# Patient Record
Sex: Female | Born: 1990 | Race: Black or African American | Hispanic: No | Smoking: Former smoker
Health system: Southern US, Community
[De-identification: ages and names within clinical notes are randomized; demographics above are authoritative.]

## PROBLEM LIST (undated history)

## (undated) DIAGNOSIS — Z789 Other specified health status: Secondary | ICD-10-CM

## (undated) HISTORY — PX: OTHER SURGICAL HISTORY: SHX169

---

## 2008-10-24 ENCOUNTER — Emergency Department: Payer: Self-pay | Admitting: Emergency Medicine

## 2008-11-13 ENCOUNTER — Emergency Department: Payer: Self-pay | Admitting: Emergency Medicine

## 2009-03-17 ENCOUNTER — Observation Stay: Payer: Self-pay

## 2009-04-28 ENCOUNTER — Inpatient Hospital Stay: Payer: Self-pay | Admitting: Obstetrics & Gynecology

## 2011-10-20 ENCOUNTER — Emergency Department: Payer: Self-pay | Admitting: Emergency Medicine

## 2011-10-20 LAB — PREGNANCY, URINE: Pregnancy Test, Urine: NEGATIVE m[IU]/mL

## 2013-01-23 IMAGING — CR DG KNEE COMPLETE 4+V*R*
1 series · 4 of 4 positions shown · non-contrast
Comparison: none

REASON FOR EXAM: pain
COMMENTS:   May transport without cardiac monitor

PROCEDURE:     DXR - DXR KNEE RT COMP WITH OBLIQUES  - October 20, 2011 [DATE]
RESULT:     Images of the right knee demonstrate no evidence of fracture,
dislocation or radiopaque foreign body.

[Series 1: ap · 0.17mm/px · 4 of 4 slices shown]
[im 1/4]
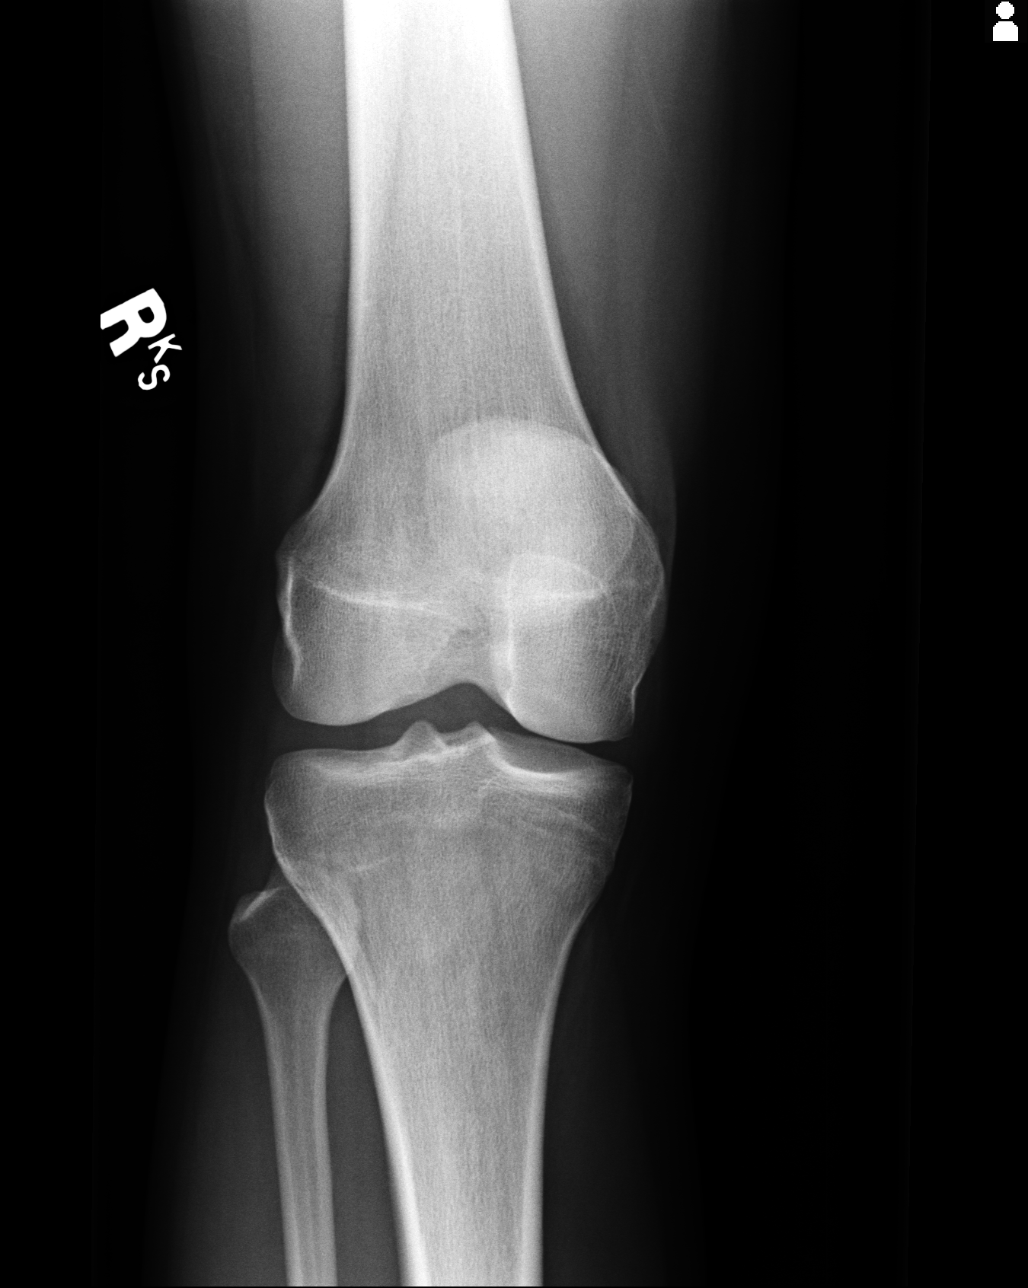
[im 2/4]
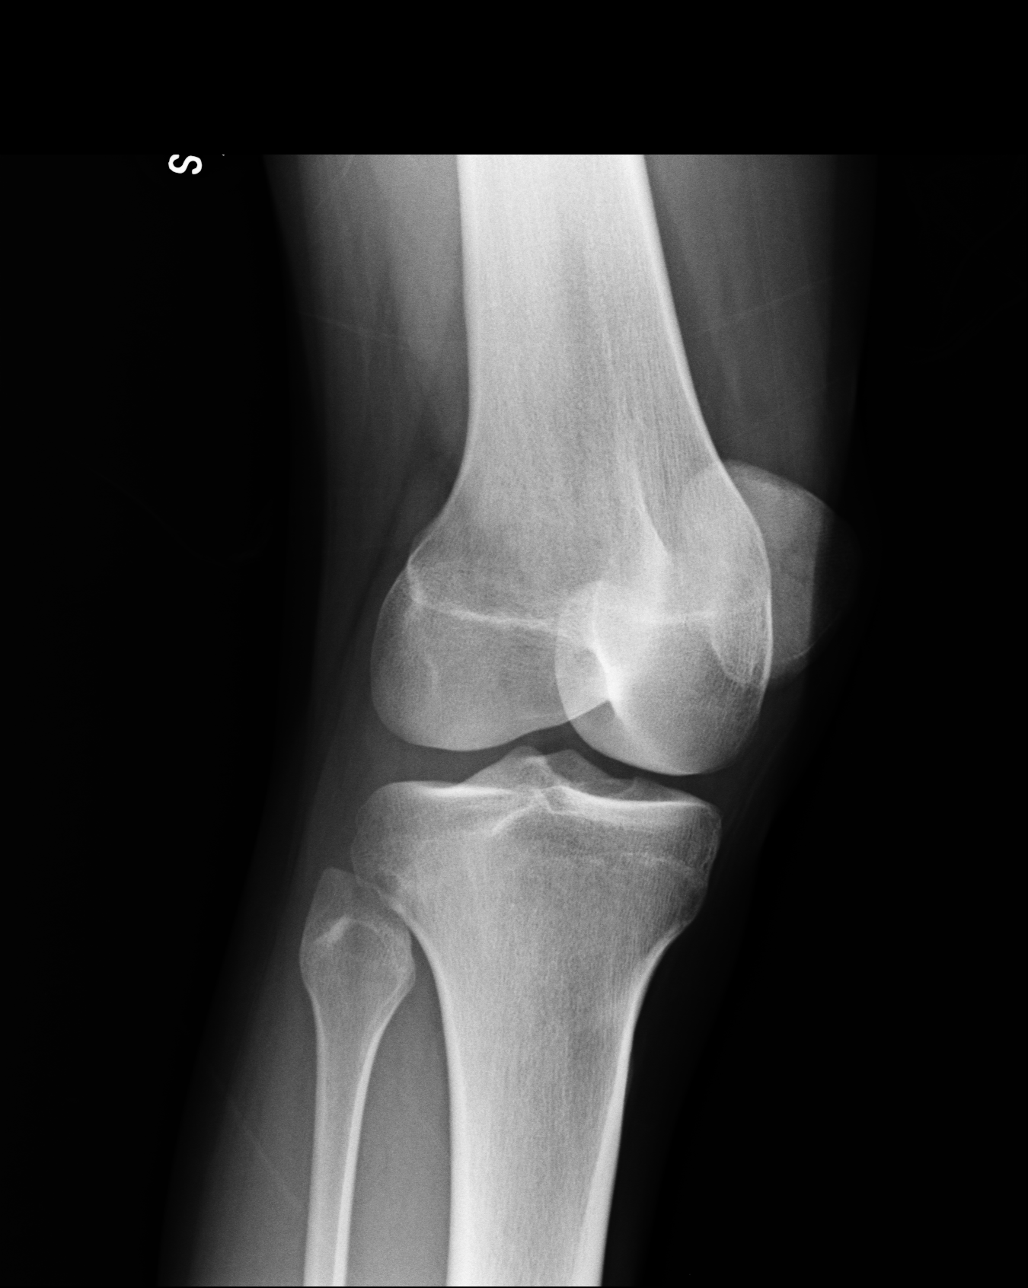
[im 3/4]
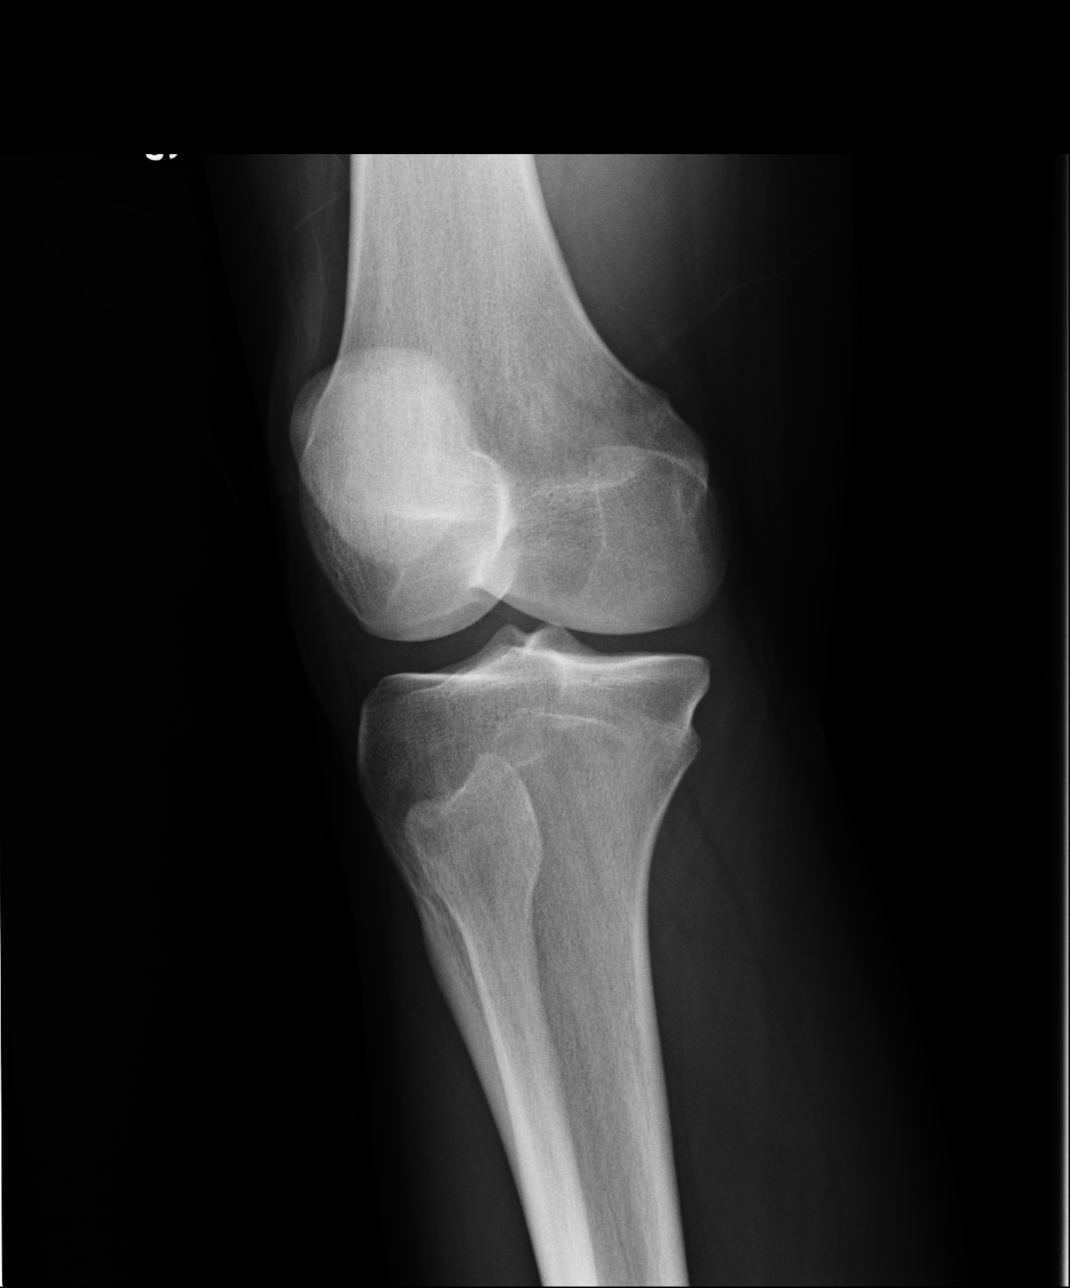
[im 4/4]
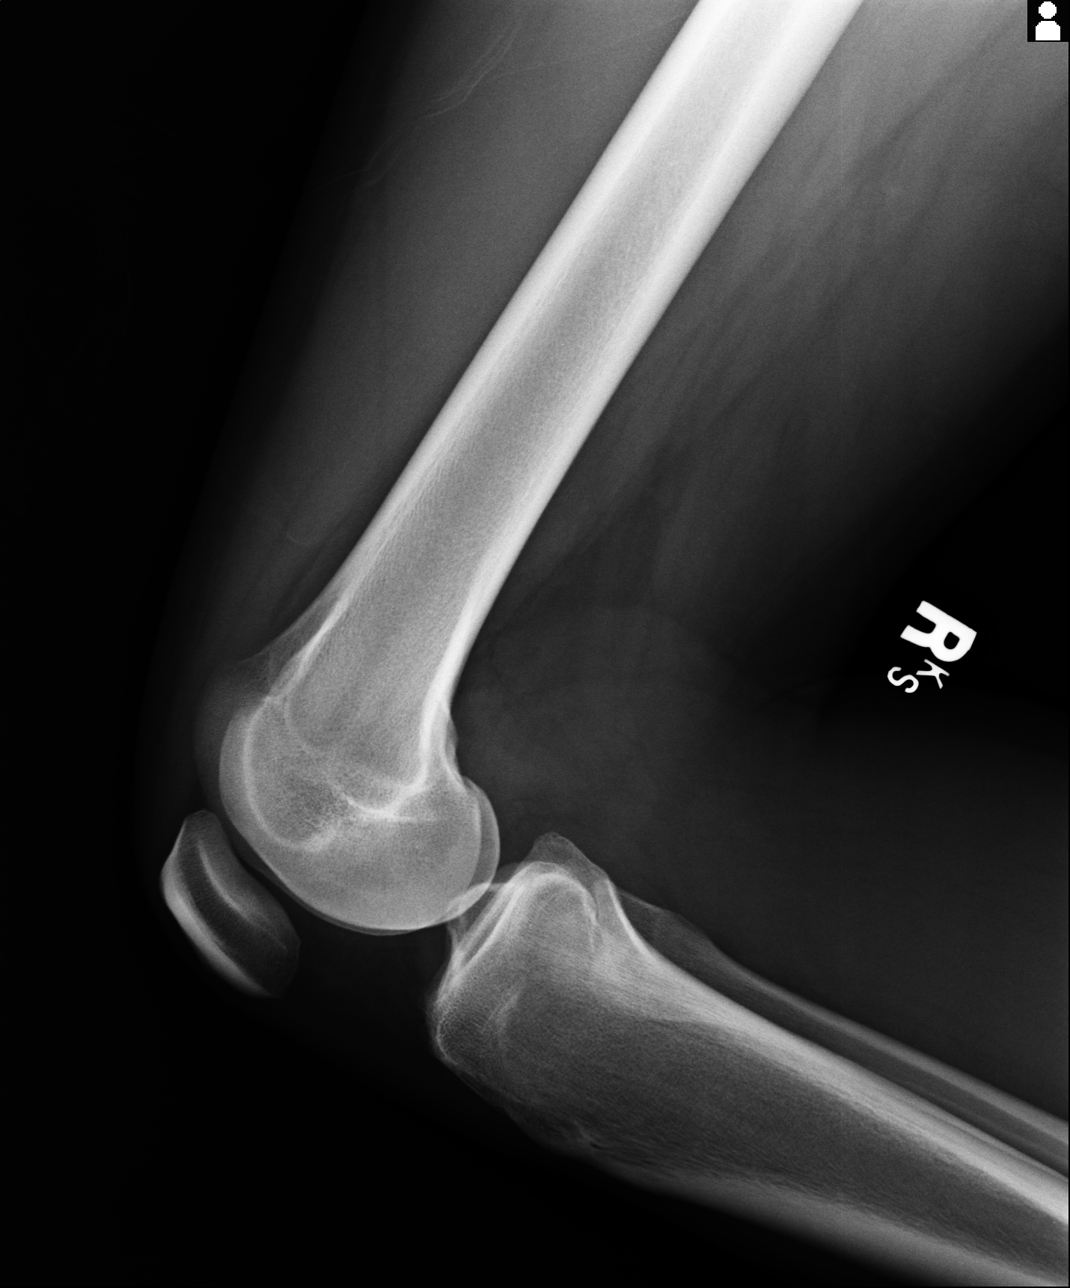

[4 of 4 positions shown; findings below may reference images not displayed]

IMPRESSION: No acute bony abnormality evident.

## 2013-01-23 IMAGING — CR RIGHT HIP - COMPLETE 2+ VIEW
1 series · 2 of 2 positions shown · non-contrast
Comparison: none

REASON FOR EXAM: pain
COMMENTS:   May transport without cardiac monitor

[Series 1: ap · 0.17mm/px · 2 of 2 slices shown]
[im 1/2]
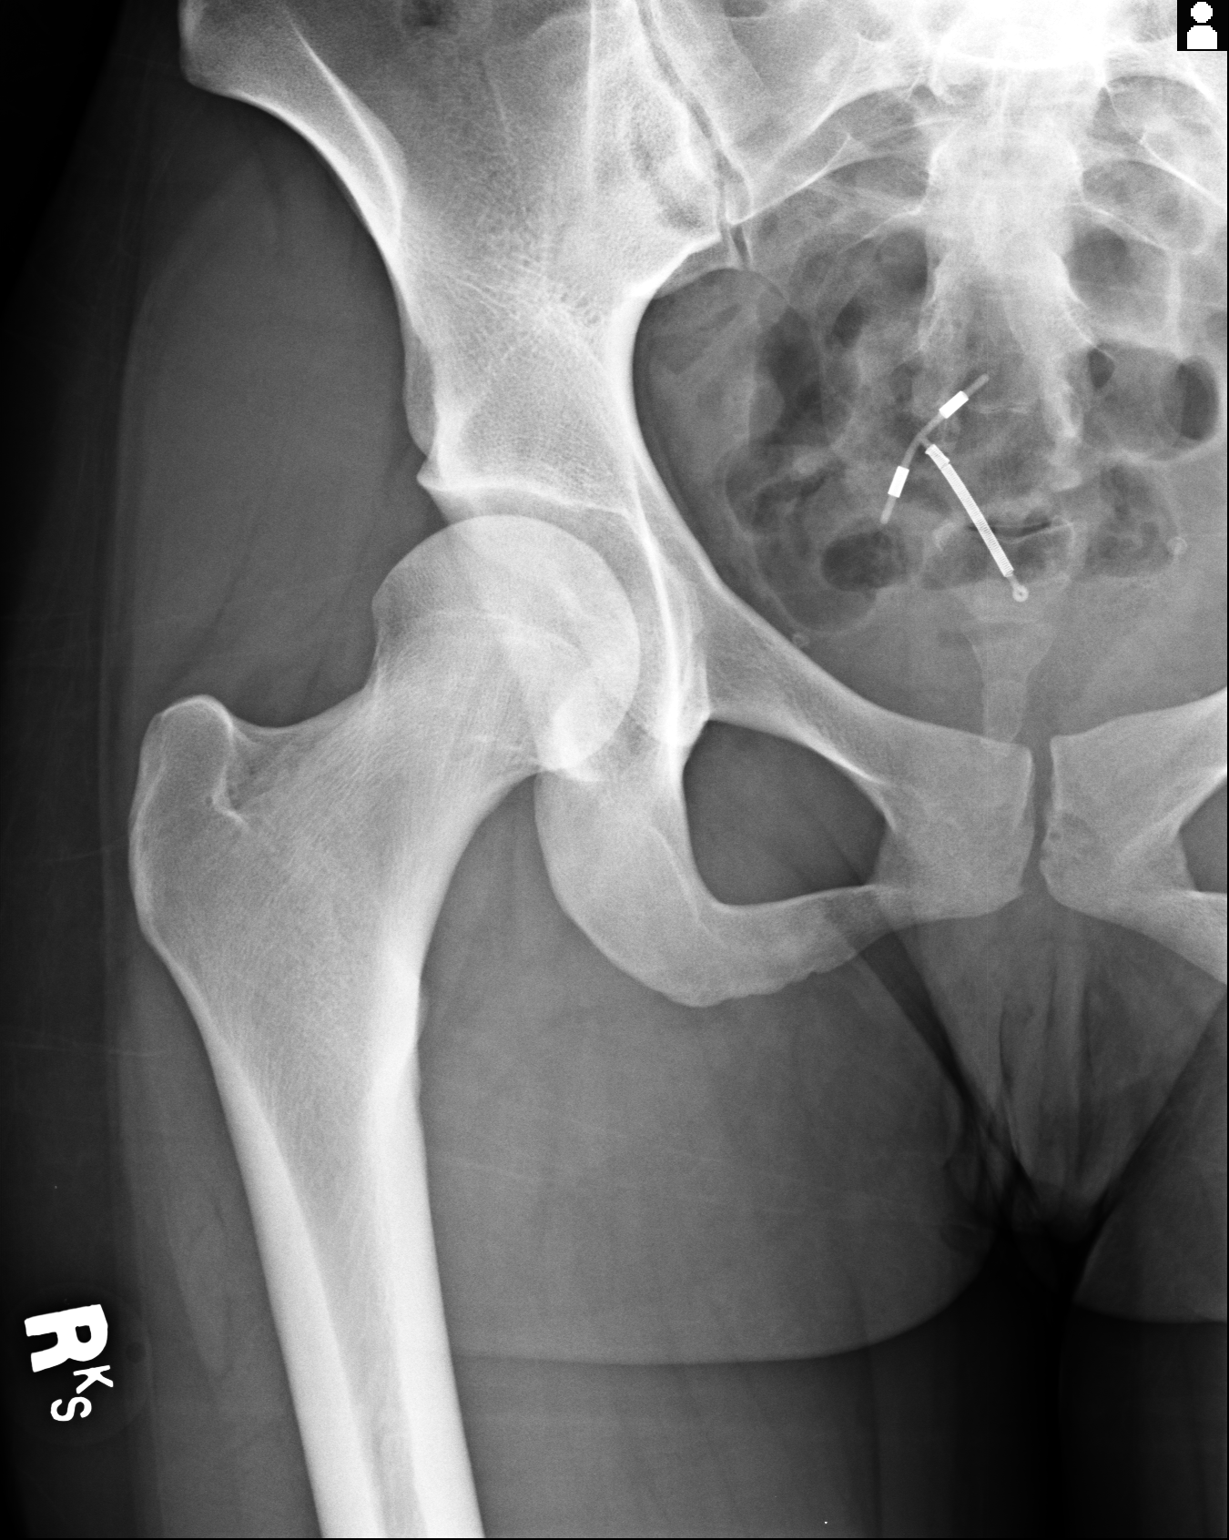
[im 2/2]
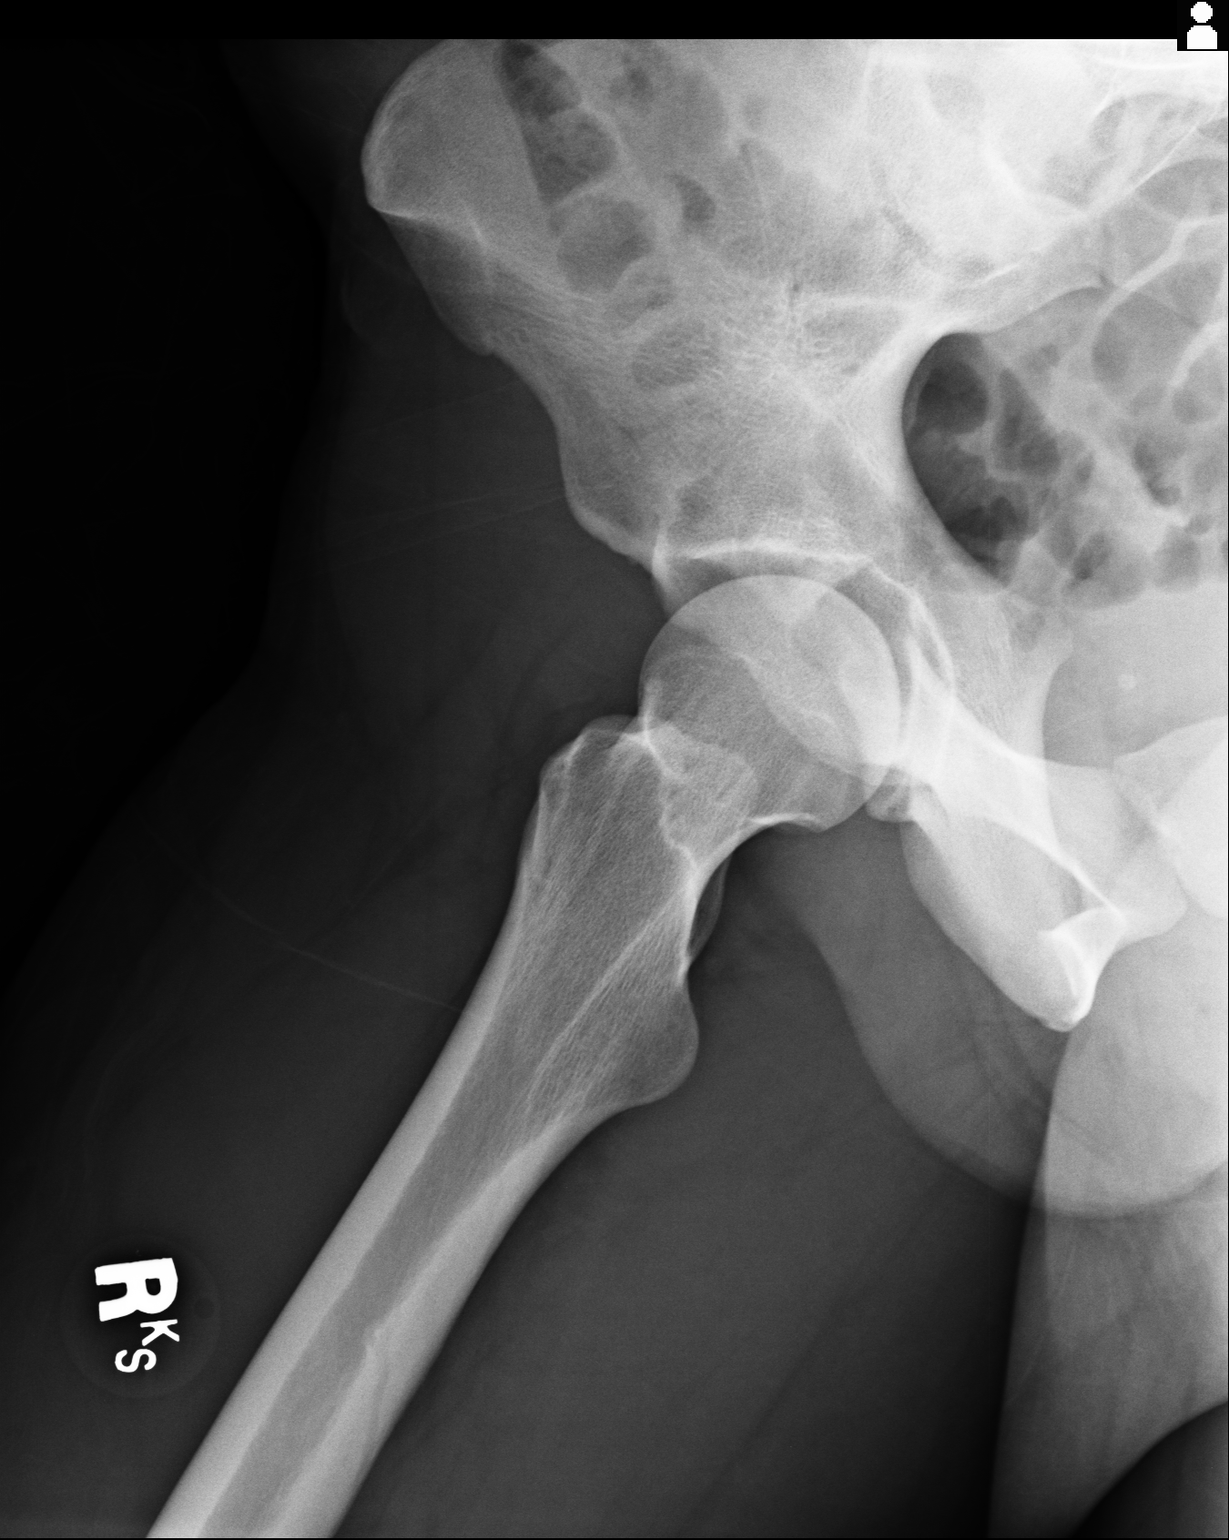

[2 of 2 positions shown; findings below may reference images not displayed]

PROCEDURE:     DXR - DXR HIP RIGHT COMPLETE  - October 20, 2011 [DATE]

RESULT:     IUD is present. Proximal femur and acetabulum appear to be
normal. There is no fracture or dislocation on the AP or frog-leg lateral
images. Prominent vascular channel is seen in the lower margin of the femur
on the frog-leg view.
IMPRESSION: Please see above.

## 2014-12-28 ENCOUNTER — Emergency Department: Payer: Self-pay | Admitting: Emergency Medicine

## 2018-01-23 LAB — HM HIV SCREENING LAB: HM HIV Screening: NEGATIVE

## 2018-01-23 LAB — HM PAP SMEAR

## 2018-03-25 DIAGNOSIS — N879 Dysplasia of cervix uteri, unspecified: Secondary | ICD-10-CM | POA: Insufficient documentation

## 2018-04-11 ENCOUNTER — Emergency Department: Payer: Medicaid Other

## 2018-04-11 ENCOUNTER — Emergency Department
Admission: EM | Admit: 2018-04-11 | Discharge: 2018-04-11 | Disposition: A | Discharge status: Home / Self Care | Payer: Medicaid Other | Attending: Emergency Medicine | Admitting: Emergency Medicine

## 2018-04-11 ENCOUNTER — Encounter: Payer: Self-pay | Admitting: Emergency Medicine

## 2018-04-11 ENCOUNTER — Other Ambulatory Visit: Payer: Self-pay

## 2018-04-11 DIAGNOSIS — J029 Acute pharyngitis, unspecified: Secondary | ICD-10-CM | POA: Diagnosis present

## 2018-04-11 DIAGNOSIS — B9689 Other specified bacterial agents as the cause of diseases classified elsewhere: Secondary | ICD-10-CM | POA: Diagnosis not present

## 2018-04-11 DIAGNOSIS — F17210 Nicotine dependence, cigarettes, uncomplicated: Secondary | ICD-10-CM | POA: Diagnosis not present

## 2018-04-11 DIAGNOSIS — J038 Acute tonsillitis due to other specified organisms: Secondary | ICD-10-CM | POA: Insufficient documentation

## 2018-04-11 LAB — COMPREHENSIVE METABOLIC PANEL
ALT: 10 U/L (ref 0–44)
AST: 16 U/L (ref 15–41)
Albumin: 4.5 g/dL (ref 3.5–5.0)
Alkaline Phosphatase: 65 U/L (ref 38–126)
Anion gap: 9 (ref 5–15)
BUN: 7 mg/dL (ref 6–20)
CHLORIDE: 106 mmol/L (ref 98–111)
CO2: 24 mmol/L (ref 22–32)
CREATININE: 0.67 mg/dL (ref 0.44–1.00)
Calcium: 9.3 mg/dL (ref 8.9–10.3)
Glucose, Bld: 95 mg/dL (ref 70–99)
Potassium: 2.9 mmol/L — ABNORMAL LOW (ref 3.5–5.1)
Sodium: 139 mmol/L (ref 135–145)
Total Bilirubin: 1.2 mg/dL (ref 0.3–1.2)
Total Protein: 8.3 g/dL — ABNORMAL HIGH (ref 6.5–8.1)

## 2018-04-11 LAB — CBC WITH DIFFERENTIAL/PLATELET
BASOS PCT: 0 %
Basophils Absolute: 0 10*3/uL (ref 0–0.1)
EOS PCT: 0 %
Eosinophils Absolute: 0 10*3/uL (ref 0–0.7)
HCT: 38.8 % (ref 35.0–47.0)
HEMOGLOBIN: 13.5 g/dL (ref 12.0–16.0)
LYMPHS ABS: 1.1 10*3/uL (ref 1.0–3.6)
Lymphocytes Relative: 7 %
MCH: 33.3 pg (ref 26.0–34.0)
MCHC: 34.9 g/dL (ref 32.0–36.0)
MCV: 95.7 fL (ref 80.0–100.0)
MONOS PCT: 6 %
Monocytes Absolute: 1 10*3/uL — ABNORMAL HIGH (ref 0.2–0.9)
NEUTROS PCT: 87 %
Neutro Abs: 13.4 10*3/uL — ABNORMAL HIGH (ref 1.4–6.5)
PLATELETS: 214 10*3/uL (ref 150–440)
RBC: 4.05 MIL/uL (ref 3.80–5.20)
RDW: 11.9 % (ref 11.5–14.5)
WBC: 15.5 10*3/uL — AB (ref 3.6–11.0)

## 2018-04-11 LAB — GROUP A STREP BY PCR: Group A Strep by PCR: NOT DETECTED

## 2018-04-11 LAB — LACTIC ACID, PLASMA: LACTIC ACID, VENOUS: 0.8 mmol/L (ref 0.5–1.9)

## 2018-04-11 MED ORDER — MAGIC MOUTHWASH W/LIDOCAINE: 5.0000 mL | Freq: Four times a day (QID) | ORAL | 0 refills | Status: DC

## 2018-04-11 MED ORDER — SODIUM CHLORIDE 0.9 % IV SOLN
3.0000 g | Freq: Once | INTRAVENOUS | Status: AC
  Administered 2018-04-11: 3 g via INTRAVENOUS
  Filled 2018-04-11: qty 3

## 2018-04-11 MED ORDER — DEXAMETHASONE SODIUM PHOSPHATE 10 MG/ML IJ SOLN
10.0000 mg | Freq: Once | INTRAMUSCULAR | Status: AC
  Administered 2018-04-11: 10 mg via INTRAVENOUS
  Filled 2018-04-11: qty 1

## 2018-04-11 MED ORDER — PREDNISONE 10 MG PO TABS: 10.0000 mg | ORAL_TABLET | Freq: Every day | ORAL | 0 refills | Status: DC

## 2018-04-11 MED ORDER — AMOXICILLIN-POT CLAVULANATE 875-125 MG PO TABS: 1.0000 | ORAL_TABLET | Freq: Two times a day (BID) | ORAL | 0 refills | Status: DC

## 2018-04-11 MED ORDER — AMOXICILLIN 875 MG PO TABS: 875.0000 mg | ORAL_TABLET | Freq: Two times a day (BID) | ORAL | 0 refills | Status: DC

## 2018-04-11 MED ORDER — IOHEXOL 350 MG/ML SOLN
60.0000 mL | Freq: Once | INTRAVENOUS | Status: AC | PRN
  Administered 2018-04-11: 75 mL via INTRAVENOUS
  Filled 2018-04-11: qty 60

## 2018-04-11 MED ORDER — DEXAMETHASONE SODIUM PHOSPHATE 10 MG/ML IJ SOLN: 10.0000 mg | Freq: Once | INTRAMUSCULAR | Status: DC

## 2018-04-11 MED ORDER — SODIUM CHLORIDE 0.9 % IV BOLUS
1000.0000 mL | Freq: Once | INTRAVENOUS | Status: AC
  Administered 2018-04-11: 1000 mL via INTRAVENOUS

## 2018-04-11 NOTE — ED Provider Notes (Signed)
[facility] Emergency Department Provider Note  ____________________________________________  Time seen: Approximately 5:09 PM  I have reviewed the triage vital signs and the nursing notes.   HISTORY  Chief Complaint Sore Throat    HPI Sharon Lambert is a 27 y.o. female who presents the emergency department complaining of right-sided sore throat.  Patient has had symptoms x2 days.  Patient denies any headache, visual changes, nasal congestion, ear pain or pressure, coughing, shortness of breath, abdominal pain, nausea or vomiting.  Patient reports sharp sore throat with voice changes.  Patient reports pain with swallowing but no difficulty swallowing or breathing.  Patient has not tried any medication for this complaint.  She denies any other complaints at this time.  No significant medical history.  No recent sick contacts.    History reviewed. No pertinent past medical history.  There are no active problems to display for this patient.   History reviewed. No pertinent surgical history.  Prior to Admission medications   Medication Sig Start Date End Date Taking? Authorizing Provider  amoxicillin (AMOXIL) 875 MG tablet Take 1 tablet (875 mg total) by mouth 2 (two) times daily. 04/11/18   Sherika Kubicki, Delorise Royals, PA-C  amoxicillin-clavulanate (AUGMENTIN) 875-125 MG tablet Take 1 tablet by mouth 2 (two) times daily. 04/11/18   [place] Urieta, Delorise Royals, PA-C  magic mouthwash w/lidocaine SOLN Take 5 mLs by mouth 4 (four) times daily. 04/11/18   Amritpal Shropshire, Delorise Royals, PA-C  predniSONE (DELTASONE) 10 MG tablet Take 1 tablet (10 mg total) by mouth daily. 04/11/18   Anijah Spohr, Delorise Royals, PA-C    Allergies Patient has no known allergies.  No family history on file.  Social History Social History   Tobacco Use  . Smoking status: Current Every Day Smoker    Packs/day: 0.50    Types: Cigarettes  Substance Use Topics  . Alcohol use: Not on file  . Drug use: Not  on file     Review of Systems  Constitutional: No fever/chills Eyes: No visual changes. No discharge ENT: Right-sided sore throat Cardiovascular: no chest pain. Respiratory: no cough. No SOB. Gastrointestinal: No abdominal pain.  No nausea, no vomiting.  No diarrhea.  No constipation. Musculoskeletal: Negative for musculoskeletal pain. Skin: Negative for rash, abrasions, lacerations, ecchymosis. Neurological: Negative for headaches, focal weakness or numbness. 10-point ROS otherwise negative.  ____________________________________________   PHYSICAL EXAM:  VITAL SIGNS: ED Triage Vitals  Enc Vitals Group     BP 04/11/18 1648 (!) 130/93     Pulse Rate 04/11/18 1648 96     Resp 04/11/18 1648 18     Temp 04/11/18 1648 99.8 F (37.7 C)     Temp Source 04/11/18 1648 Oral     SpO2 04/11/18 1648 97 %     Weight 04/11/18 1648 158 lb (71.7 kg)     Height 04/11/18 1648 5\' 8"  (1.727 m)     Head Circumference --      Peak Flow --      Pain Score 04/11/18 1650 9     Pain Loc --      Pain Edu? --      Excl. in GC? --      Constitutional: Alert and oriented. Well appearing and in no acute distress. Eyes: Conjunctivae are normal. PERRL. EOMI. Head: Atraumatic. ENT:      Ears: With cerumen but no impaction.  TMs unremarkable bilaterally.      Nose: No congestion/rhinnorhea.      Mouth/Throat: Mucous membranes  are moist.  Right tonsil is grossly edematous, erythematous, exudates.  Left tonsil is mildly erythematous but not grossly edematous and no exudates.  Uvula is deviated.  Visualization of the oral cavity is otherwise unremarkable. Neck: No stridor.  Neck is supple full range of motion Hematological/Lymphatic/Immunilogical: Diffuse right-sided cervical lymphadenopathy that is tender to palpation.. Cardiovascular: Normal rate, regular rhythm. Normal S1 and S2.  Good peripheral circulation. Respiratory: Normal respiratory effort without tachypnea or retractions. Lungs CTAB. Good  air entry to the bases with no decreased or absent breath sounds. Musculoskeletal: Full range of motion to all extremities. No gross deformities appreciated. Neurologic:  Normal speech and language. No gross focal neurologic deficits are appreciated.  Skin:  Skin is warm, dry and intact. No rash noted. Psychiatric: Mood and affect are normal. Speech and behavior are normal. Patient exhibits appropriate insight and judgement.   ____________________________________________   LABS (all labs ordered are listed, but only abnormal results are displayed)  Labs Reviewed  COMPREHENSIVE METABOLIC PANEL - Abnormal; Notable for the following components:      Result Value   Potassium 2.9 (*)    Total Protein 8.3 (*)    All other components within normal limits  CBC WITH DIFFERENTIAL/PLATELET - Abnormal; Notable for the following components:   WBC 15.5 (*)    Neutro Abs 13.4 (*)    Monocytes Absolute 1.0 (*)    All other components within normal limits  GROUP A STREP BY PCR  CULTURE, BLOOD (ROUTINE X 2)  CULTURE, BLOOD (ROUTINE X 2)  LACTIC ACID, PLASMA   ____________________________________________  EKG   ____________________________________________  RADIOLOGY I personally viewed and evaluated these images as part of my medical decision making, as well as reviewing the written report by the radiologist.  Ct Soft Tissue Neck W Contrast  Result Date: 04/11/2018 CLINICAL DATA:  27 y/o F; sore throat since yesterday. Hard time swallowing. EXAM: CT NECK WITH CONTRAST TECHNIQUE: Multidetector CT imaging of the neck was performed using the standard protocol following the bolus administration of intravenous contrast. CONTRAST:  75mL OMNIPAQUE IOHEXOL 350 MG/ML SOLN COMPARISON:  None. FINDINGS: Pharynx and larynx: Right larger than left palatine tonsil enlargement. No discrete rim enhancing abscess. Mucosal thickening predominantly within the right lateral oropharyngeal and hypopharyngeal wall and  partial effacement of the right aspect of vallecula. Normal sized epiglottis. No laryngeal inflammation. Salivary glands: No inflammation, mass, or stone. Thyroid: Normal. Lymph nodes: None enlarged or abnormal density. Vascular: Negative. Limited intracranial: Negative. Visualized orbits: Negative. Mastoids and visualized paranasal sinuses: Mild left maxillary sinus mucosal thickening. Skeleton: No acute or aggressive process. Upper chest: Negative. Other: None. IMPRESSION: Predominantly right-sided pharyngitis and palatine tonsil inflammation. No discrete rim enhancing collection identified at this time. Electronically Signed   By: Mitzi Hansen M.D.   On: 04/11/2018 19:25    ____________________________________________    PROCEDURES  Procedure(s) performed:    Procedures    Medications  Ampicillin-Sulbactam (UNASYN) 3 g in sodium chloride 0.9 % 100 mL IVPB (0 g Intravenous Stopped 04/11/18 1833)  sodium chloride 0.9 % bolus 1,000 mL (0 mLs Intravenous Stopped 04/11/18 1954)  dexamethasone (DECADRON) injection 10 mg (10 mg Intravenous Given 04/11/18 1758)  iohexol (OMNIPAQUE) 350 MG/ML injection 60 mL (75 mLs Intravenous Contrast Given 04/11/18 1858)     ____________________________________________   INITIAL IMPRESSION / ASSESSMENT AND PLAN / ED COURSE  Pertinent labs & imaging results that were available during my care of the patient were reviewed by me and considered in my  medical decision making (see chart for details).  Review of the [place] CSRS was performed in accordance of the [facility] prior to dispensing any controlled drugs.  Clinical Course as of Apr 11 2005  Sat Apr 11, 2018  1725 She presents emergency department with sore throat, voice changes x2 days.  Patient denies any other URI symptoms.  Exam reveals grossly hypertrophied tonsil with exudates and erythema.  Uvula deviation.  Voice changes that are muffled/reports are appreciated during interview.  Differential at  this time includes peritonsillar abscess, tonsillitis, tonsillar stone, strep, viral pharyngitis.  Given patient's physical exam findings, she will be evaluated with lab work, imaging and I will empirically administer Decadron and Unasyn.   [JC]    Clinical Course User Index [JC] Johndavid Geralds, Delorise RoyalsJonathan D, PA-C     Patient's diagnosis is consistent with right-sided acute bacterial tonsillitis.  Patient presents the emergency department with 2 days of sore throat, voice changes.  On exam, differential is most consistent with peritonsillar abscess a detailed work-up was performed.  Results returned overall with reassuring results with leukocytosis and tonsillitis with no appreciable abscess on CT scan.  Given patient's findings, ENT referral for surgical drainage is not warranted.  Patient was given Unasyn and Decadron in the emergency department.  She will be discharged home with prescription for amoxicillin, Augmentin, Magic mouthwash for symptom relief.  Patient is also placed on 12-day taper of prednisone.  Patient will follow-up with ENT as necessary.  Patient is given ED precautions to return to the ED for any worsening or new symptoms.     ____________________________________________  FINAL CLINICAL IMPRESSION(S) / ED DIAGNOSES  Final diagnoses:  Acute bacterial tonsillitis      NEW MEDICATIONS STARTED DURING THIS VISIT:  ED Discharge Orders        Ordered    amoxicillin-clavulanate (AUGMENTIN) 875-125 MG tablet  2 times daily     04/11/18 1942    amoxicillin (AMOXIL) 875 MG tablet  2 times daily     04/11/18 1942    predniSONE (DELTASONE) 10 MG tablet  Daily    Note to Pharmacy:  Take 6 pills x 2 days, 5 pills x 2 days, 4 pills x 2 days, 3 pills x 2 days, 2 pills x 2 days, and 1 pill x 2 days   04/11/18 1942    magic mouthwash w/lidocaine SOLN  4 times daily    Note to Pharmacy:  Dispense in a 1/1/1 ratio. Use lidocaine, diphenhydramine, prednisolone   04/11/18 1942           This chart was dictated using voice recognition software/Dragon. Despite best efforts to proofread, errors can occur which can change the meaning. Any change was purely unintentional.    Racheal PatchesCuthriell, Mackinzie Vuncannon D, PA-C 04/11/18 2007    Jeanmarie PlantMcShane, James A, MD 04/11/18 2150

## 2018-04-11 NOTE — ED Triage Notes (Signed)
Sore throat since yesterday.

## 2018-04-16 LAB — CULTURE, BLOOD (ROUTINE X 2)
Culture: NO GROWTH
Culture: NO GROWTH

## 2019-10-12 ENCOUNTER — Ambulatory Visit: Payer: HRSA Program | Attending: Internal Medicine

## 2019-10-12 DIAGNOSIS — Z20828 Contact with and (suspected) exposure to other viral communicable diseases: Secondary | ICD-10-CM | POA: Diagnosis present

## 2019-10-12 DIAGNOSIS — Z20822 Contact with and (suspected) exposure to covid-19: Secondary | ICD-10-CM

## 2019-10-13 LAB — NOVEL CORONAVIRUS, NAA: SARS-CoV-2, NAA: NOT DETECTED

## 2019-10-15 NOTE — L&D Delivery Note (Signed)
Delivery Note At 2:30 AM a viable female was delivered via Vaginal, Spontaneous (Presentation: Middle Occiput Anterior).  APGAR: 8, 9; weight 6 lb 15.8 oz (3170 g).   Placenta status: Spontaneous, Intact.  Cord: 3 vessels with the following complications: Short cord and did not cause complications.  Cord pH: n/a  Anesthesia: Epidural Episiotomy: None Lacerations: 2nd degree;Vaginal;Perineal Suture Repair: 3.0 vicryl Est. Blood Loss (mL): 250  Mom to postpartum.  Baby to Couplet care / Skin to Skin.  Called to see patient.  Mom pushed to deliver a viable female infant.  The head followed by shoulders, which delivered without difficulty, and the rest of the body.  No nuchal cord noted.  Baby to mom's chest.  Cord clamped and cut after > 1 min delay.  No cord blood obtained.  Placenta delivered spontaneously, intact, with a 3-vessel cord.  Second degree perineal laceration repaired with 3-0 Vicryl in standard fashion.  All counts correct.  Hemostasis obtained with IV pitocin and fundal massage. EBL 250 mL.  Placenta examined, velamentous cord insertion. Otherwise, appears normal.   Thomasene Mohair, MD 09/07/2020, 2:57 AM

## 2020-01-21 ENCOUNTER — Telehealth: Payer: Self-pay

## 2020-01-21 NOTE — Telephone Encounter (Signed)
Pap letter mailed. Neima Lacross, RN  

## 2020-03-02 ENCOUNTER — Ambulatory Visit (LOCAL_COMMUNITY_HEALTH_CENTER): Payer: Medicaid Other

## 2020-03-02 ENCOUNTER — Other Ambulatory Visit: Payer: Self-pay

## 2020-03-02 ENCOUNTER — Ambulatory Visit: Payer: Medicaid Other | Admitting: Physician Assistant

## 2020-03-02 VITALS — BP 129/80 | Ht 68.0 in | Wt 172.4 lb

## 2020-03-02 DIAGNOSIS — Z3200 Encounter for pregnancy test, result unknown: Secondary | ICD-10-CM

## 2020-03-02 LAB — PREGNANCY, URINE: Preg Test, Ur: POSITIVE — AB

## 2020-03-02 MED ORDER — PRENATAL VITAMINS 28-0.8 MG PO TABS: 1.0000 | ORAL_TABLET | Freq: Every day | ORAL | 0 refills | Status: AC

## 2020-03-02 NOTE — Progress Notes (Signed)
In for PT-plans care at [facility]; has Medicaid Sharlette Dense, RN

## 2020-03-02 NOTE — Addendum Note (Signed)
Addended by: Sharlette Dense on: 03/02/2020 04:16 PM   Modules accepted: Orders

## 2020-03-10 ENCOUNTER — Encounter: Payer: Self-pay | Admitting: Advanced Practice Midwife

## 2020-03-10 ENCOUNTER — Ambulatory Visit (INDEPENDENT_AMBULATORY_CARE_PROVIDER_SITE_OTHER): Payer: Medicaid Other | Admitting: Advanced Practice Midwife

## 2020-03-10 ENCOUNTER — Other Ambulatory Visit: Payer: Self-pay

## 2020-03-10 ENCOUNTER — Other Ambulatory Visit (HOSPITAL_COMMUNITY)
Admission: RE | Admit: 2020-03-10 | Discharge: 2020-03-10 | Disposition: A | Discharge status: Home / Self Care | Payer: Medicaid Other | Source: Ambulatory Visit | Attending: Advanced Practice Midwife | Admitting: Advanced Practice Midwife

## 2020-03-10 VITALS — BP 110/70 | Wt 171.0 lb

## 2020-03-10 DIAGNOSIS — Z3481 Encounter for supervision of other normal pregnancy, first trimester: Secondary | ICD-10-CM | POA: Insufficient documentation

## 2020-03-10 DIAGNOSIS — Z124 Encounter for screening for malignant neoplasm of cervix: Secondary | ICD-10-CM | POA: Diagnosis present

## 2020-03-10 DIAGNOSIS — Z113 Encounter for screening for infections with a predominantly sexual mode of transmission: Secondary | ICD-10-CM | POA: Insufficient documentation

## 2020-03-10 DIAGNOSIS — Z9889 Other specified postprocedural states: Secondary | ICD-10-CM | POA: Insufficient documentation

## 2020-03-10 DIAGNOSIS — O3441 Maternal care for other abnormalities of cervix, first trimester: Secondary | ICD-10-CM

## 2020-03-10 DIAGNOSIS — Z3490 Encounter for supervision of normal pregnancy, unspecified, unspecified trimester: Secondary | ICD-10-CM | POA: Insufficient documentation

## 2020-03-10 NOTE — Progress Notes (Signed)
NOB- constipation

## 2020-03-10 NOTE — Progress Notes (Signed)
New Obstetric Patient H&P    Chief Complaint: "Desires prenatal care"   History of Present Illness: Patient is a 29 y.o. [id number] Not Hispanic or Latino female, presents with amenorrhea and positive home pregnancy test. Patient's last menstrual period was 12/13/2019 (exact date). and based on her  LMP, her EDD is Estimated Date of Delivery: 09/18/20 and her EGA is [redacted]w[redacted]d. Cycles are 5 days, regular, and occur approximately every : 28 days. Her last pap smear was 1 or 2 years ago and was high-grade squamous intraepithelial neoplasia  (HGSIL-encompassing moderate and severe dysplasia). She had a Top-Hat LEEP on 11/19/2018 at [facility].   She had a urine pregnancy test which was positive 2 month(s)  ago. Her last menstrual period was normal and lasted for  4 or 5 day(s). Since her LMP she claims she has experienced breast tenderness, fatigue, nausea, vomiting. She denies vaginal bleeding. Her past medical history is noncontributory. Her prior pregnancies are notable for G1 36w 6#9oz female SVD precipitous labor/delivery, G2 TAB 2019  Since her LMP, she admits to the use of tobacco products  no She claims she has gained   11 pounds since the start of her pregnancy.  There are cats in the home in the home  no  She admits close contact with children on a regular basis  yes  She has had chicken pox in the past no She has had Tuberculosis exposures, symptoms, or previously tested positive for TB   no Current or past history of domestic violence. no  Genetic Screening/Teratology Counseling: (Includes patient, baby's father, or anyone in either family with:)   1. Patient's age >/= 56 [facility]  no 2. Thalassemia ([place], [place], Mediterranean, or Asian background): MCV<80  no 3. Neural tube defect (meningomyelocele, spina bifida, anencephaly)  no 4. Congenital heart defect  no  5. Down syndrome  no 6. Tay-Sachs (Jewish, [place]  no 7. Canavan's Disease  no 8. Sickle cell disease or trait (African)  no    9. Hemophilia or other blood disorders  no  10. Muscular dystrophy  no  11. Cystic fibrosis  no  12. Huntington's Chorea  no  13. Mental retardation/autism  no 14. Other inherited genetic or chromosomal disorder  no 15. Maternal metabolic disorder (DM, PKU, etc)  no 16. Patient or FOB with a child with a birth defect not listed above no  16a. Patient or FOB with a birth defect themselves no 17. Recurrent pregnancy loss, or stillbirth  no  18. Any medications since LMP other than prenatal vitamins (include vitamins, supplements, OTC meds, drugs, alcohol)  no 19. Any other genetic/environmental exposure to discuss  no  Infection History:   1. Lives with someone with TB or TB exposed  no  2. Patient or partner has history of genital herpes  no 3. Rash or viral illness since LMP  no 4. History of STI (GC, CT, HPV, syphilis, HIV)  no 5. History of recent travel :  no  Other pertinent information:  no     Review of Systems:10 point review of systems negative unless otherwise noted in HPI  Past Medical History:  Patient Active Problem List   Diagnosis Date Noted  . Supervision of normal pregnancy 03/10/2020    [facility]inic [facility] Prenatal Labs  Dating  Blood type:     Genetic Screen 1 Screen:    AFP:     Quad:     NIPS: Antibody:   Anatomic [place]  Rubella:  Varicella: @VZVIGG @  GTT Early:               Third trimester:  RPR:     Rhogam  HBsAg:     Vaccines TDAP:                       Flu Shot: HIV:     Baby Food plans formula- reviewed breastfeeding education            GBS:   Contraception  Pap: Hx HGSIL 2019 with LEEP 2020  CBB     CS/VBAC    Support Person Partner [name]        . History of loop electrosurgical excision procedure (LEEP) of cervix affecting pregnancy in first trimester 03/10/2020  . 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 Dysplasia Hx:  1. 02/09/18: ASC-H, trichomonas. Trichomonas treated  2. 03/25/18:  5'o'clock cervical bx CIN2-3; ECC CIN 1 > LEEP (patient lost to follow up and did not present for LEEP) 3. 09/16/18: Pap HSIL ; Cervical bx 11 and 6 o'clock CIN 3; ECC CIN 3 > LEEP  4. 11/18/18: LEEP [ ]      Past Surgical History:  Past Surgical History:  Procedure Laterality Date  . cerivcal Leep     01/2019    Gynecologic History: Patient's last menstrual period was 12/13/2019 (exact date).  Obstetric History: 02/2019  Family History:  Family History  Problem Relation Age of Onset  . Diabetes Father   . Skin cancer Father   . Prostate cancer Paternal Grandfather     Social History:  Social History   Socioeconomic History  . Marital status: Single    Spouse name: Not on file  . Number of children: Not on file  . Years of education: Not on file  . Highest education level: Not on file  Occupational History  . Not on file  Tobacco Use  . Smoking status: Former Smoker    Packs/day: 0.50    Types: Cigarettes  . Smokeless tobacco: Never Used  Substance and Sexual Activity  . Alcohol use: Not Currently  . Drug use: Never  . Sexual activity: Yes    Partners: Male    Birth control/protection: None  Other Topics Concern  . Not on file  Social History Narrative  . Not on file   Social Determinants of Health   Financial Resource Strain:   . Difficulty of Paying Living Expenses:   Food Insecurity:   . Worried About 02/12/2020 in the Last Year:   . [id number] in the Last Year:   Transportation Needs:   . Programme researcher, broadcasting/film/video (Medical):   Barista Lack of Transportation (Non-Medical):   Physical Activity:   . Days of Exercise per Week:   . Minutes of Exercise per Session:   Stress:   . Feeling of Stress :   Social Connections:   . Frequency of Communication with Friends and Family:   . Frequency of Social Gatherings with Friends and Family:   . Attends Religious Services:   . Active Member of Clubs or Organizations:   . Attends Freight forwarder  Meetings:   Marland Kitchen Marital Status:   Intimate Partner Violence: Not At Risk  . Fear of Current or Ex-Partner: No  . Emotionally Abused: No  . Physically Abused: No  . Sexually Abused: No    Allergies:  No Known Allergies  Medications:  Prior to Admission medications   Medication Sig Start Date End Date Taking? Authorizing Provider  magic mouthwash w/lidocaine SOLN Take 5 mLs by mouth 4 (four) times daily. Patient not taking: Reported on 03/02/2020 04/11/18   Cuthriell, Delorise Royals, PA-C  Prenatal Vit-Fe Fumarate-FA (PRENATAL VITAMINS) 28-0.8 MG TABS Take 1 tablet by mouth daily. 03/02/20 06/10/20  Landry Dyke, PA-C    Physical Exam Vitals: Blood pressure 110/70, weight 171 lb (77.6 kg), last menstrual period 12/13/2019.  General: NAD HEENT: normocephalic, anicteric Thyroid: no enlargement, no palpable nodules Pulmonary: No increased work of breathing, CTAB Cardiovascular: RRR, distal pulses 2+ Abdomen: NABS, soft, non-tender, non-distended.  Umbilicus without lesions.  No hepatomegaly, splenomegaly or masses palpable. No evidence of hernia, FHTs 150  Genitourinary:  External: Normal external female genitalia.  Normal urethral meatus, normal  Bartholin's and Skene's glands.    Vagina: Normal vaginal mucosa, no evidence of prolapse.    Cervix: Grossly normal in appearance, friable with PAP, no CMT  Uterus:  Non-enlarged, mobile, normal contour.    Adnexa: ovaries non-enlarged, no adnexal masses  Rectal: deferred Extremities: no edema, erythema, or tenderness Neurologic: Grossly intact Psychiatric: mood appropriate, affect full  The following were addressed during this visit:  Breastfeeding Education - Early initiation of breastfeeding    Comments: Keeps milk supply adequate, helps contract uterus and slow bleeding, and early milk is the perfect first food and is easy to digest.   - The importance of exclusive breastfeeding    Comments: Provides antibodies, Lower risk of  breast and ovarian cancers, and type-2 diabetes,Helps your body recover, Reduced chance of SIDS.   - Risks of giving your baby anything other than breast milk if you are breastfeeding    Comments: Make the baby less content with breastfeeds, may make my baby more susceptible to illness, and may reduce my milk supply.   - The importance of early skin-to-skin contact    Comments: Keeps baby warm and secure, helps keep baby's blood sugar up and breathing steady, easier to bond and breastfeed, and helps calm baby.  - Rooming-in on a 24-hour basis    Comments: Easier to learn baby's feeding cues, easier to bond and get to know each other, and encourages milk production.   - Feeding on demand or baby-led feeding    Comments: Helps prevent breastfeeding complications, helps bring in good milk supply, prevents under or overfeeding, and helps baby feel content and satisfied   - Frequent feeding to help assure optimal milk production    Comments: Making a full supply of milk requires frequent removal of milk from breasts, infant will eat 8-12 times in 24 hours, if separated from infant use breast massage, hand expression and/ or pumping to remove milk from breasts.   - Effective positioning and attachment    Comments: Helps my baby to get enough breast milk, helps to produce an adequate milk supply, and helps prevent nipple pain and damage   - Exclusive breastfeeding for the first 6 months    Comments: Builds a healthy milk supply and keeps it up, protects baby from sickness and disease, and breastmilk has everything your baby needs for the first 6 months.  - Individualized Education    Comments: Contraindications to breastfeeding and other special medical conditions Patient tried to breastfeed first baby without success and thinks she wants to formula feed. She did accept breastfeeding education and encouragement.    Assessment: 29 y.o. [id number] at [redacted]w[redacted]d by LMP presenting to initiate  prenatal  care  Plan: 1) Avoid alcoholic beverages. 2) Patient encouraged not to smoke.  3) Discontinue the use of all non-medicinal drugs and chemicals.  4) Take prenatal vitamins daily.  5) Nutrition, food safety (fish, cheese advisories, and high nitrite foods) and exercise discussed. 6) Hospital and practice style discussed with cross coverage system.  7) Genetic Screening, such as with 1st Trimester Screening, cell free fetal DNA, AFP testing, and Ultrasound, as well as with amniocentesis and CVS as appropriate, is discussed with patient. At the conclusion of today's visit patient requested cell free DNA and MSAFP genetic testing 8) Patient is asked about travel to areas at risk for the Zika virus, and counseled to avoid travel and exposure to mosquitoes or sexual partners who may have themselves been exposed to the virus. Testing is discussed, and will be ordered as appropriate.  9) PAPtima, urine culture, NOB panel, sickle cell screen, MaterniT 21 today 10) Return in 1 week for dating scan and rob   Rod Can, [facility] 03/10/2020, 4:02 PM

## 2020-03-10 NOTE — Patient Instructions (Signed)
Exercise During Pregnancy Exercise is an important part of being healthy for people of all ages. Exercise improves the function of your heart and lungs and helps you maintain strength, flexibility, and a healthy body weight. Exercise also boosts energy levels and elevates mood. Most women should exercise regularly during pregnancy. In rare cases, women with certain medical conditions or complications may be asked to limit or avoid exercise during pregnancy. How does this affect me? Along with maintaining general strength and flexibility, exercising during pregnancy can help:  Keep strength in muscles that are used during labor and childbirth.  Decrease low back pain.  Reduce symptoms of depression.  Control weight gain during pregnancy.  Reduce the risk of needing insulin if you develop diabetes during pregnancy.  Decrease the risk of cesarean delivery.  Speed up your recovery after giving birth. How does this affect my baby? Exercise can help you have a healthy pregnancy. Exercise does not cause premature birth. It will not cause your baby to weigh less at birth. What exercises can I do? Many exercises are safe for you to do during pregnancy. Do a variety of exercises that safely increase your heart and breathing rates and help you build and maintain muscle strength. Do exercises exactly as told by your health care provider. You may do these exercises:  Walking or hiking.  Swimming.  Water aerobics.  Riding a stationary bike.  Strength training.  Modified yoga or Pilates. Tell your instructor that you are pregnant. Avoid overstretching, and avoid lying on your back for long periods of time.  Running or jogging. Only choose this type of exercise if you: ? Ran or jogged regularly before your pregnancy. ? Can run or jog and still talk in complete sentences. What exercises should I avoid? Depending on your level of fitness and whether you exercised regularly before your  pregnancy, you may be told to limit high-intensity exercise. You can tell that you are exercising at a high intensity if you are breathing much harder and faster and cannot hold a conversation while exercising. You must avoid:  Contact sports.  Activities that put you at risk for falling on or being hit in the belly, such as downhill skiing, water skiing, surfing, rock climbing, cycling, gymnastics, and horseback riding.  Scuba diving.  Skydiving.  Yoga or Pilates in a room that is heated to high temperatures.  Jogging or running, unless you ran or jogged regularly before your pregnancy. While jogging or running, you should always be able to talk in full sentences. Do not run or jog so fast that you are unable to have a conversation.  Do not exercise at more than 6,000 feet above sea level (high elevation) if you are not used to exercising at high elevation. How do I exercise in a safe way?   Avoid overheating. Do not exercise in very high temperatures.  Wear loose-fitting, breathable clothes.  Avoid dehydration. Drink enough water before, during, and after exercise to keep your urine pale yellow.  Avoid overstretching. Because of hormone changes during pregnancy, it is easy to overstretch muscles, tendons, and ligaments during pregnancy.  Start slowly and ask your health care provider to recommend the types of exercise that are safe for you.  Do not exercise to lose weight. Follow these instructions at home:  Exercise on most days or all days of the week. Try to exercise for 30 minutes a day, 5 days a week, unless your health care provider tells you not to.  If   you actively exercised before your pregnancy and you are healthy, your health care provider may tell you to continue to do moderate to high-intensity exercise.  If you are just starting to exercise or did not exercise much before your pregnancy, your health care provider may tell you to do low to moderate-intensity  exercise. Questions to ask your health care provider  Is exercise safe for me?  What are signs that I should stop exercising?  Does my health condition mean that I should not exercise during pregnancy?  When should I avoid exercising during pregnancy? Stop exercising and contact a health care provider if: You have any unusual symptoms, such as:  Mild contractions of the uterus or cramps in the abdomen.  Dizziness that does not go away when you rest. Stop exercising and get help right away if: You have any unusual symptoms, such as:  Sudden, severe pain in your low back or your belly.  Mild contractions of the uterus or cramps in the abdomen that do not improve with rest and drinking fluids.  Chest pain.  Bleeding or fluid leaking from your vagina.  Shortness of breath. These symptoms may represent a serious problem that is an emergency. Do not wait to see if the symptoms will go away. Get medical help right away. Call your local emergency services (911 in the [place] Do not drive yourself to the hospital. Summary  Most women should exercise regularly throughout pregnancy. In rare cases, women with certain medical conditions or complications may be asked to limit or avoid exercise during pregnancy.  Do not exercise to lose weight during pregnancy.  Your health care provider will tell you what level of physical activity is right for you.  Stop exercising and contact a health care provider if you have mild contractions of the uterus or cramps in the abdomen. Get help right away if these contractions or cramps do not improve with rest and drinking fluids.  Stop exercising and get help right away if you have sudden, severe pain in your low back or belly, chest pain, shortness of breath, or bleeding or leaking of fluid from your vagina. This information is not intended to replace advice given to you by your health care provider. Make sure you discuss any questions you have with your  health care provider. Document Revised: 01/21/2019 Document Reviewed: 11/04/2018 Elsevier Patient Education  2020 [employer] Eating Plan for Pregnant Women While you are pregnant, your body requires additional nutrition to help support your growing baby. You also have a higher need for some vitamins and minerals, such as folic acid, calcium, iron, and vitamin D. Eating a healthy, well-balanced diet is very important for your health and your baby's health. Your need for extra calories varies for the three 3-month segments of your pregnancy (trimesters). For most women, it is recommended to consume:  150 extra calories a day during the first trimester.  300 extra calories a day during the second trimester.  300 extra calories a day during the third trimester. What are tips for following this plan?   Do not try to lose weight or go on a diet during pregnancy.  Limit your overall intake of foods that have "empty calories." These are foods that have little nutritional value, such as sweets, desserts, candies, and sugar-sweetened beverages.  Eat a variety of foods (especially fruits and vegetables) to get a full range of vitamins and minerals.  Take a prenatal vitamin to help meet your additional vitamin and mineral needs   during pregnancy, specifically for folic acid, iron, calcium, and vitamin D.  Remember to stay active. Ask your health care provider what types of exercise and activities are safe for you.  Practice good food safety and cleanliness. Wash your hands before you eat and after you prepare raw meat. Wash all fruits and vegetables well before peeling or eating. Taking these actions can help to prevent food-borne illnesses that can be very dangerous to your baby, such as listeriosis. Ask your health care provider for more information about listeriosis. What does 150 extra calories look like? Healthy options that provide 150 extra calories each day could be any of the  following:  6-8 oz (170-230 g) of plain low-fat yogurt with  cup of berries.  1 apple with 2 teaspoons (11 g) of peanut butter.  Cut-up vegetables with  cup (60 g) of hummus.  8 oz (230 mL) or 1 cup of low-fat chocolate milk.  1 stick of string cheese with 1 medium orange.  1 peanut butter and jelly sandwich that is made with one slice of whole-wheat bread and 1 tsp (5 g) of peanut butter. For 300 extra calories, you could eat two of those healthy options each day. What is a healthy amount of weight to gain? The right amount of weight gain for you is based on your BMI before you became pregnant. If your BMI:  Was less than 18 (underweight), you should gain 28-40 lb (13-18 kg).  Was 18-24.9 (normal), you should gain 25-35 lb (11-16 kg).  Was 25-29.9 (overweight), you should gain 15-25 lb (7-11 kg).  Was 30 or greater (obese), you should gain 11-20 lb (5-9 kg). What if I am having twins or multiples? Generally, if you are carrying twins or multiples:  You may need to eat 300-600 extra calories a day.  The recommended range for total weight gain is 25-54 lb (11-25 kg), depending on your BMI before pregnancy.  Talk with your health care provider to find out about nutritional needs, weight gain, and exercise that is right for you. What foods can I eat?  Fruits All fruits. Eat a variety of colors and types of fruit. Remember to wash your fruits well before peeling or eating. Vegetables All vegetables. Eat a variety of colors and types of vegetables. Remember to wash your vegetables well before peeling or eating. Grains All grains. Choose whole grains, such as whole-wheat bread, oatmeal, or brown rice. Meats and other protein foods Lean meats, including chicken, turkey, fish, and lean cuts of beef, veal, or pork. If you eat fish or seafood, choose options that are higher in omega-3 fatty acids and lower in mercury, such as salmon, herring, mussels, trout, sardines, pollock,  shrimp, crab, and lobster. Tofu. Tempeh. Beans. Eggs. Peanut butter and other nut butters. Make sure that all meats, poultry, and eggs are cooked to food-safe temperatures or "well-done." Two or more servings of fish are recommended each week in order to get the most benefits from omega-3 fatty acids that are found in seafood. Choose fish that are lower in mercury. You can find more information online:  www.fda.gov Dairy Pasteurized milk and milk alternatives (such as almond milk). Pasteurized yogurt and pasteurized cheese. Cottage cheese. Sour cream. Beverages Water. Juices that contain 100% fruit juice or vegetable juice. Caffeine-free teas and decaffeinated coffee. Drinks that contain caffeine are okay to drink, but it is better to avoid caffeine. Keep your total caffeine intake to less than 200 mg each day (which is 12 oz   or 355 mL of coffee, tea, or soda) or the limit as told by your health care provider. Fats and oils Fats and oils are okay to include in moderation. Sweets and desserts Sweets and desserts are okay to include in moderation. Seasoning and other foods All pasteurized condiments. The items listed above may not be a complete list of foods and beverages you can eat. Contact a dietitian for more information. What foods are not recommended? Fruits Unpasteurized fruit juices. Vegetables Raw (unpasteurized) vegetable juices. Meats and other protein foods Lunch meats, bologna, hot dogs, or other deli meats. (If you must eat those meats, reheat them until they are steaming hot.) Refrigerated pat, meat spreads from a meat counter, smoked seafood that is found in the refrigerated section of a store. Raw or undercooked meats, poultry, and eggs. Raw fish, such as sushi or sashimi. Fish that have high mercury content, such as tilefish, shark, swordfish, and king mackerel. To learn more about mercury in fish, talk with your health care provider or look for online resources, such  as:  www.fda.gov Dairy Raw (unpasteurized) milk and any foods that have raw milk in them. Soft cheeses, such as feta, queso blanco, queso fresco, Brie, Camembert cheeses, blue-veined cheeses, and Panela cheese (unless it is made with pasteurized milk, which must be stated on the label). Beverages Alcohol. Sugar-sweetened beverages, such as sodas, teas, or energy drinks. Seasoning and other foods Homemade fermented foods and drinks, such as pickles, sauerkraut, or kombucha drinks. (Store-bought pasteurized versions of these are okay.) Salads that are made in a store or deli, such as ham salad, chicken salad, egg salad, tuna salad, and seafood salad. The items listed above may not be a complete list of foods and beverages you should avoid. Contact a dietitian for more information. Where to find more information To calculate the number of calories you need based on your height, weight, and activity level, you can use an online calculator such as:  www.choosemyplate.gov/MyPlatePlan To calculate how much weight you should gain during pregnancy, you can use an online pregnancy weight gain calculator such as:  www.choosemyplate.gov/pregnancy-weight-gain-calculator Summary  While you are pregnant, your body requires additional nutrition to help support your growing baby.  Eat a variety of foods, especially fruits and vegetables to get a full range of vitamins and minerals.  Practice good food safety and cleanliness. Wash your hands before you eat and after you prepare raw meat. Wash all fruits and vegetables well before peeling or eating. Taking these actions can help to prevent food-borne illnesses, such as listeriosis, that can be very dangerous to your baby.  Do not eat raw meat or fish. Do not eat fish that have high mercury content, such as tilefish, shark, swordfish, and king mackerel. Do not eat unpasteurized (raw) dairy.  Take a prenatal vitamin to help meet your additional vitamin and  mineral needs during pregnancy, specifically for folic acid, iron, calcium, and vitamin D. This information is not intended to replace advice given to you by your health care provider. Make sure you discuss any questions you have with your health care provider. Document Revised: 02/18/2019 Document Reviewed: 06/27/2017 Elsevier Patient Education  2020 [employer] Prenatal Care Prenatal care is health care during pregnancy. It helps you and your unborn baby (fetus) stay as healthy as possible. Prenatal care may be provided by a midwife, a family practice health care provider, or a childbirth and pregnancy specialist (obstetrician). How does this affect me? During pregnancy, you will be closely monitored   for any new conditions that might develop. To lower your risk of pregnancy complications, you and your health care provider will talk about any underlying conditions you have. How does this affect my baby? Early and consistent prenatal care increases the chance that your baby will be healthy during pregnancy. Prenatal care lowers the risk that your baby will be:  Born early (prematurely).  Smaller than expected at birth (small for gestational age). What can I expect at the first prenatal care visit? Your first prenatal care visit will likely be the longest. You should schedule your first prenatal care visit as soon as you know that you are pregnant. Your first visit is a good time to talk about any questions or concerns you have about pregnancy. At your visit, you and your health care provider will talk about:  Your medical history, including: ? Any past pregnancies. ? Your family's medical history. ? The baby's father's medical history. ? Any long-term (chronic) health conditions you have and how you manage them. ? Any surgeries or procedures you have had. ? Any current over-the-counter or prescription medicines, herbs, or supplements you are taking.  Other factors that could pose a risk  to your baby, including:  Your home setting and your stress levels, including: ? Exposure to abuse or violence. ? Household financial strain. ? Mental health conditions you have.  Your daily health habits, including diet and exercise. Your health care provider will also:  Measure your weight, height, and blood pressure.  Do a physical exam, including a pelvic and breast exam.  Perform blood tests and urine tests to check for: ? Urinary tract infection. ? Sexually transmitted infections (STIs). ? Low iron levels in your blood (anemia). ? Blood type and certain proteins on red blood cells (Rh antibodies). ? Infections and immunity to viruses, such as hepatitis B and rubella. ? HIV (human immunodeficiency virus).  Do an ultrasound to confirm your baby's growth and development and to help predict your estimated due date (EDD). This ultrasound is done with a probe that is inserted into the vagina (transvaginal ultrasound).  Discuss your options for genetic screening.  Give you information about how to keep yourself and your baby healthy, including: ? Nutrition and taking vitamins. ? Physical activity. ? How to manage pregnancy symptoms such as nausea and vomiting (morning sickness). ? Infections and substances that may be harmful to your baby and how to avoid them. ? Food safety. ? Dental care. ? Working. ? Travel. ? Warning signs to watch for and when to call your health care provider. How often will I have prenatal care visits? After your first prenatal care visit, you will have regular visits throughout your pregnancy. The visit schedule is often as follows:  Up to week 28 of pregnancy: once every 4 weeks.  28-36 weeks: once every 2 weeks.  After 36 weeks: every week until delivery. Some women may have visits more or less often depending on any underlying health conditions and the health of the baby. Keep all follow-up and prenatal care visits as told by your health care  provider. This is important. What happens during routine prenatal care visits? Your health care provider will:  Measure your weight and blood pressure.  Check for fetal heart sounds.  Measure the height of your uterus in your abdomen (fundal height). This may be measured starting around week 20 of pregnancy.  Check the position of your baby inside your uterus.  Ask questions about your diet, sleeping patterns, and   whether you can feel the baby move.  Review warning signs to watch for and signs of labor.  Ask about any pregnancy symptoms you are having and how you are dealing with them. Symptoms may include: ? Headaches. ? Nausea and vomiting. ? Vaginal discharge. ? Swelling. ? Fatigue. ? Constipation. ? Any discomfort, including back or pelvic pain. Make a list of questions to ask your health care provider at your routine visits. What tests might I have during prenatal care visits? You may have blood, urine, and imaging tests throughout your pregnancy, such as:  Urine tests to check for glucose, protein, or signs of infection.  Glucose tests to check for a form of diabetes that can develop during pregnancy (gestational diabetes mellitus). This is usually done around week 24 of pregnancy.  An ultrasound to check your baby's growth and development and to check for birth defects. This is usually done around week 20 of pregnancy.  A test to check for group B strep (GBS) infection. This is usually done around week 36 of pregnancy.  Genetic testing. This may include blood or imaging tests, such as an ultrasound. Some genetic tests are done during the first trimester and some are done during the second trimester. What else can I expect during prenatal care visits? Your health care provider may recommend getting certain vaccines during pregnancy. These may include:  A yearly flu shot (annual influenza vaccine). This is especially important if you will be pregnant during flu  season.  Tdap (tetanus, diphtheria, pertussis) vaccine. Getting this vaccine during pregnancy can protect your baby from whooping cough (pertussis) after birth. This vaccine may be recommended between weeks 27 and 36 of pregnancy. Later in your pregnancy, your health care provider may give you information about:  Childbirth and breastfeeding classes.  Choosing a health care provider for your baby.  Umbilical cord banking.  Breastfeeding.  Birth control after your baby is born.  The hospital labor and delivery unit and how to tour it.  Registering at the hospital before you go into labor. Where to find more information  Office on Women's Health: womenshealth.gov  American Pregnancy Association: americanpregnancy.org  March of Dimes: marchofdimes.org Summary  Prenatal care helps you and your baby stay as healthy as possible during pregnancy.  Your first prenatal care visit will most likely be the longest.  You will have visits and tests throughout your pregnancy to monitor your health and your baby's health.  Bring a list of questions to your visits to ask your health care provider.  Make sure to keep all follow-up and prenatal care visits with your health care provider. This information is not intended to replace advice given to you by your health care provider. Make sure you discuss any questions you have with your health care provider. Document Revised: 01/20/2019 Document Reviewed: 09/29/2017 Elsevier Patient Education  2020 [employer]  

## 2020-03-12 ENCOUNTER — Encounter: Payer: Self-pay | Admitting: Advanced Practice Midwife

## 2020-03-12 LAB — URINE CULTURE: Organism ID, Bacteria: NO GROWTH

## 2020-03-14 LAB — RPR+RH+ABO+RUB AB+AB SCR+CB...
Antibody Screen: NEGATIVE
HIV Screen 4th Generation wRfx: NONREACTIVE
Hematocrit: 35.3 % (ref 34.0–46.6)
Hemoglobin: 12.3 g/dL (ref 11.1–15.9)
Hepatitis B Surface Ag: NEGATIVE
MCH: 33.8 pg — ABNORMAL HIGH (ref 26.6–33.0)
MCHC: 34.8 g/dL (ref 31.5–35.7)
MCV: 97 fL (ref 79–97)
Platelets: 243 10*3/uL (ref 150–450)
RBC: 3.64 x10E6/uL — ABNORMAL LOW (ref 3.77–5.28)
RDW: 11.2 % — ABNORMAL LOW (ref 11.7–15.4)
RPR Ser Ql: NONREACTIVE
Rh Factor: POSITIVE
Rubella Antibodies, IGG: 3.43 index (ref >0.99)
Varicella zoster IgG: 558 index (ref >165)
WBC: 9.2 10*3/uL (ref 3.4–10.8)

## 2020-03-14 LAB — HGB FRACTIONATION CASCADE
Hgb A2: 2.5 % (ref 1.8–3.2)
Hgb A: 96 % — ABNORMAL LOW (ref 96.4–98.8)
Hgb F: 1.5 % (ref 0.0–2.0)
Hgb S: 0 %

## 2020-03-16 LAB — CYTOLOGY - PAP
Chlamydia: NEGATIVE
Comment: NEGATIVE
Comment: NEGATIVE
Comment: NEGATIVE
Comment: NORMAL
Diagnosis: NEGATIVE
High risk HPV: NEGATIVE
Neisseria Gonorrhea: NEGATIVE
Trichomonas: NEGATIVE

## 2020-03-16 LAB — MATERNIT 21 PLUS CORE, BLOOD
Fetal Fraction: 5
Result (T21): NEGATIVE
Trisomy 13 (Patau syndrome): NEGATIVE
Trisomy 18 (Edwards syndrome): NEGATIVE
Trisomy 21 (Down syndrome): NEGATIVE

## 2020-03-22 ENCOUNTER — Other Ambulatory Visit: Payer: Self-pay

## 2020-03-22 ENCOUNTER — Encounter: Payer: Self-pay | Admitting: Obstetrics and Gynecology

## 2020-03-22 ENCOUNTER — Ambulatory Visit (INDEPENDENT_AMBULATORY_CARE_PROVIDER_SITE_OTHER): Payer: Medicaid Other | Admitting: Obstetrics and Gynecology

## 2020-03-22 ENCOUNTER — Other Ambulatory Visit: Payer: Self-pay | Admitting: Advanced Practice Midwife

## 2020-03-22 ENCOUNTER — Ambulatory Visit (INDEPENDENT_AMBULATORY_CARE_PROVIDER_SITE_OTHER): Payer: Medicaid Other

## 2020-03-22 VITALS — BP 110/70 | Ht 68.0 in | Wt 171.0 lb

## 2020-03-22 DIAGNOSIS — Z3402 Encounter for supervision of normal first pregnancy, second trimester: Secondary | ICD-10-CM

## 2020-03-22 DIAGNOSIS — O3441 Maternal care for other abnormalities of cervix, first trimester: Secondary | ICD-10-CM

## 2020-03-22 DIAGNOSIS — Z3481 Encounter for supervision of other normal pregnancy, first trimester: Secondary | ICD-10-CM | POA: Diagnosis not present

## 2020-03-22 DIAGNOSIS — Z9889 Other specified postprocedural states: Secondary | ICD-10-CM

## 2020-03-22 LAB — POCT URINALYSIS DIPSTICK OB
Glucose, UA: NEGATIVE
POC,PROTEIN,UA: NEGATIVE

## 2020-03-22 NOTE — Patient Instructions (Signed)

## 2020-03-22 NOTE — Progress Notes (Signed)
      Routine Prenatal Care Visit  Subjective  Sharon Lambert is a 29 y.o. [phone] at [redacted]w[redacted]d being seen today for ongoing prenatal care.  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  ----------------------------------------------------------------------------------- Patient reports no complaints.   Contractions: Not present. Vag. Bleeding: None.  Movement: Absent. Denies leaking of fluid.  ----------------------------------------------------------------------------------- The following portions of the patient's history were reviewed and updated as appropriate: allergies, current medications, past family history, past medical history, past social history, past surgical history and problem list. Problem list updated.   Objective  Blood pressure 110/70, height 5\' 8"  (1.727 m), weight 171 lb (77.6 kg), last menstrual period 12/13/2019. Pregravid weight 160 lb (72.6 kg) Total Weight Gain 11 lb (4.99 kg) Urinalysis:      Fetal Status: Fetal Heart Rate (bpm): 138   Movement: Absent     General:  Alert, oriented and cooperative. Patient is in no acute distress.  Skin: Skin is warm and dry. No rash noted.   Cardiovascular: Normal heart rate noted  Respiratory: Normal respiratory effort, no problems with respiration noted  Abdomen: Soft, gravid, appropriate for gestational age. Pain/Pressure: Absent     Pelvic:  Cervical exam deferred        Extremities: Normal range of motion.  Edema: None  Mental Status: Normal mood and affect. Normal behavior. Normal judgment and thought content.     Assessment   28 y.o. 02/12/2020 at [redacted]w[redacted]d by  09/18/2020, by Last Menstrual Period presenting for routine prenatal visit  Plan   Pregnant Problems (from 03/02/20 to present)    Problem Noted Resolved   Supervision of normal pregnancy 03/10/2020 by 03/12/2020, CNM No   Overview Addendum 03/22/2020  3:28 PM by 05/22/2020, MD    [facility]inic [facility]atal [facility]  Dating Lmp=14wk Natale Milch Blood type: B/Positive/-- (05/28 1600)   Genetic Screen  NIPS: normal xx Antibody:Negative (05/28 1600)  Anatomic 11-02-1976  Rubella: 3.43 (05/28 1600)  Varicella: immune  GTT Third trimester:  RPR: Non Reactive (05/28 1600)   Rhogam Not needed HBsAg: Negative (05/28 1600)   Vaccines TDAP:                       Flu Shot: HIV: Non Reactive (05/28 1600)   Baby Food plans formula- reviewed breastfeeding education            GBS:   Contraception  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    Return in about 2 weeks (around 04/05/2020) for ROB in person and TVUS cervical length with MD.  04/07/2020 MD [facility], [facility] 03/22/2020, 3:29 PM

## 2020-03-22 NOTE — Addendum Note (Signed)
Addended by: Clement Husbands A on: 03/22/2020 03:40 PM   Modules accepted: Orders

## 2020-03-31 ENCOUNTER — Other Ambulatory Visit: Payer: Medicaid Other

## 2020-04-03 ENCOUNTER — Other Ambulatory Visit: Payer: Self-pay

## 2020-04-03 ENCOUNTER — Ambulatory Visit (LOCAL_COMMUNITY_HEALTH_CENTER): Payer: Medicaid Other

## 2020-04-03 DIAGNOSIS — Z111 Encounter for screening for respiratory tuberculosis: Secondary | ICD-10-CM

## 2020-04-06 ENCOUNTER — Ambulatory Visit (INDEPENDENT_AMBULATORY_CARE_PROVIDER_SITE_OTHER): Payer: Medicaid Other | Admitting: Obstetrics and Gynecology

## 2020-04-06 ENCOUNTER — Ambulatory Visit (INDEPENDENT_AMBULATORY_CARE_PROVIDER_SITE_OTHER): Payer: Medicaid Other

## 2020-04-06 ENCOUNTER — Other Ambulatory Visit: Payer: Self-pay

## 2020-04-06 ENCOUNTER — Ambulatory Visit (LOCAL_COMMUNITY_HEALTH_CENTER): Payer: Medicaid Other | Admitting: Family Medicine

## 2020-04-06 ENCOUNTER — Encounter: Payer: Self-pay | Admitting: Obstetrics and Gynecology

## 2020-04-06 VITALS — BP 134/72 | Ht 68.0 in | Wt 173.4 lb

## 2020-04-06 DIAGNOSIS — Z3A16 16 weeks gestation of pregnancy: Secondary | ICD-10-CM

## 2020-04-06 DIAGNOSIS — Z3402 Encounter for supervision of normal first pregnancy, second trimester: Secondary | ICD-10-CM

## 2020-04-06 DIAGNOSIS — O3441 Maternal care for other abnormalities of cervix, first trimester: Secondary | ICD-10-CM

## 2020-04-06 DIAGNOSIS — Z111 Encounter for screening for respiratory tuberculosis: Secondary | ICD-10-CM

## 2020-04-06 DIAGNOSIS — Z9889 Other specified postprocedural states: Secondary | ICD-10-CM

## 2020-04-06 LAB — POCT URINALYSIS DIPSTICK OB
Glucose, UA: NEGATIVE
POC,PROTEIN,UA: NEGATIVE

## 2020-04-06 LAB — TB SKIN TEST
Induration: 0 mm
TB Skin Test: NEGATIVE

## 2020-04-06 NOTE — Addendum Note (Signed)
Addended by: Clement Husbands A on: 04/06/2020 04:06 PM   Modules accepted: Orders

## 2020-04-06 NOTE — Progress Notes (Signed)
    Routine Prenatal Care Visit  Subjective  Sharon Lambert is a 29 y.o. [phone] at [redacted]w[redacted]d being seen today for ongoing prenatal care.  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  ----------------------------------------------------------------------------------- Patient reports no complaints.   Contractions: Not present. Vag. Bleeding: None.  Movement: Absent. Denies leaking of fluid.  ----------------------------------------------------------------------------------- The following portions of the patient's history were reviewed and updated as appropriate: allergies, current medications, past family history, past medical history, past social history, past surgical history and problem list. Problem list updated.   Objective  Blood pressure 134/72, height 5\' 8"  (1.727 m), weight 173 lb 6.4 oz (78.7 kg), last menstrual period 12/13/2019. Pregravid weight 160 lb (72.6 kg) Total Weight Gain 13 lb 6.4 oz (6.078 kg) Urinalysis:      Fetal Status: Fetal Heart Rate (bpm): 130   Movement: Absent     General:  Alert, oriented and cooperative. Patient is in no acute distress.  Skin: Skin is warm and dry. No rash noted.   Cardiovascular: Normal heart rate noted  Respiratory: Normal respiratory effort, no problems with respiration noted  Abdomen: Soft, gravid, appropriate for gestational age. Pain/Pressure: Absent     Pelvic:  Cervical exam deferred        Extremities: Normal range of motion.  Edema: None  Mental Status: Normal mood and affect. Normal behavior. Normal judgment and thought content.     Assessment   28 y.o. 02/12/2020 at [redacted]w[redacted]d by  09/18/2020, by Last Menstrual Period presenting for routine prenatal visit  Plan   Pregnant Problems (from 03/02/20 to  present)    Problem Noted Resolved   Supervision of normal pregnancy 03/10/2020 by 03/12/2020, CNM No   Overview Addendum 03/22/2020  3:28 PM by 05/22/2020, MD    [facility]  Dating Lmp=14wk Natale Milch Blood type: B/Positive/-- (05/28 1600)   Genetic Screen  NIPS: normal xx Antibody:Negative (05/28 1600)  Anatomic 11-02-1976  Rubella: 3.43 (05/28 1600)  Varicella: immune  GTT Third trimester:  RPR: Non Reactive (05/28 1600)   Rhogam Not needed HBsAg: Negative (05/28 1600)   Vaccines TDAP:                       Flu Shot: HIV: Non Reactive (05/28 1600)   Baby Food plans formula- reviewed breastfeeding education            GBS:   Contraception  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    Return in about 4 weeks (around 05/04/2020) for ROB in person and anatomy 05/06/2020.  [place] MD [facility], [facility] 04/06/2020, 3:55 PM

## 2020-04-06 NOTE — Patient Instructions (Signed)

## 2020-05-04 ENCOUNTER — Ambulatory Visit (INDEPENDENT_AMBULATORY_CARE_PROVIDER_SITE_OTHER): Payer: Medicaid Other

## 2020-05-04 ENCOUNTER — Ambulatory Visit (INDEPENDENT_AMBULATORY_CARE_PROVIDER_SITE_OTHER): Payer: Medicaid Other | Admitting: Obstetrics

## 2020-05-04 ENCOUNTER — Other Ambulatory Visit: Payer: Self-pay

## 2020-05-04 VITALS — BP 120/70 | Wt 175.0 lb

## 2020-05-04 DIAGNOSIS — O358XX0 Maternal care for other (suspected) fetal abnormality and damage, not applicable or unspecified: Secondary | ICD-10-CM

## 2020-05-04 DIAGNOSIS — Z3480 Encounter for supervision of other normal pregnancy, unspecified trimester: Secondary | ICD-10-CM

## 2020-05-04 DIAGNOSIS — O35EXX0 Maternal care for other (suspected) fetal abnormality and damage, fetal genitourinary anomalies, not applicable or unspecified: Secondary | ICD-10-CM

## 2020-05-04 DIAGNOSIS — Z3A20 20 weeks gestation of pregnancy: Secondary | ICD-10-CM

## 2020-05-04 DIAGNOSIS — Z3402 Encounter for supervision of normal first pregnancy, second trimester: Secondary | ICD-10-CM | POA: Diagnosis not present

## 2020-05-04 NOTE — Progress Notes (Signed)
Routine Prenatal Care Visit  Subjective  Sharon Lambert is a 29 y.o. [phone] at [redacted]w[redacted]d being seen today for ongoing prenatal care.  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  ----------------------------------------------------------------------------------- Patient reports no complaints.  She just completed her anatomy scan and knows that she has a girl.   Contractions: Not present. Vag. Bleeding: None.  Movement: Present. Leaking Fluid denies.  ----------------------------------------------------------------------------------- The following portions of the patient's history were reviewed and updated as appropriate: allergies, current medications, past family history, past medical history, past social history, past surgical history and problem list. Problem list updated.  Objective  Blood pressure 120/70, weight 175 lb (79.4 kg), last menstrual period 12/13/2019. Pregravid weight 160 lb (72.6 kg) Total Weight Gain 15 lb (6.804 kg) Urinalysis: Urine Protein    Urine Glucose    Fetal Status: Fetal Heart Rate (bpm): 144   Movement: Present     General:  Alert, oriented and cooperative. Patient is in no acute distress.  Skin: Skin is warm and dry. No rash noted.   Cardiovascular: Normal heart rate noted  Respiratory: Normal respiratory effort, no problems with respiration noted  Abdomen: Soft, gravid, appropriate for gestational age. Pain/Pressure: Absent     Pelvic:  Cervical exam deferred        Extremities: Normal range of motion.  Edema: None  Mental Status: Normal mood and affect. Normal behavior. Normal judgment and thought content.   See anatomy ultrasound report: left polycystic kidney noted. Assessment   29 y.o. [id number] at [redacted]w[redacted]d by  09/18/2020, by  Last Menstrual Period presenting for routine prenatal visit  Plan   Pregnant Problems (from 03/02/20 to present)    Problem Noted Resolved   Supervision of normal pregnancy 03/10/2020 by Tresea Mall, CNM No   Overview Addendum 03/22/2020  3:28 PM by Natale Milch, MD    [facility]  Dating Lmp=14wk [place] Blood type: B/Positive/-- (05/28 1600)   Genetic Screen  NIPS: normal xx Antibody:Negative (05/28 1600)  Anatomic [place]  Rubella: 3.43 (05/28 1600)  Varicella: immune  GTT Third trimester:  RPR: Non Reactive (05/28 1600)   Rhogam Not needed HBsAg: Negative (05/28 1600)   Vaccines TDAP:                       Flu Shot: HIV: Non Reactive (05/28 1600)   Baby Food plans formula- reviewed breastfeeding education            GBS:   Contraception  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 Please refer to After Visit Summary for other counseling recommendations.  We discussed the anatomy scan findings today, and a referral to MFM has been made for a detailed sono and consultation regarding POC.  Return in about 4 weeks (around 06/01/2020) for return OB.  Jackelyn Poling  Eunice Blase, [facility]  05/04/2020 3:19 PM

## 2020-05-05 ENCOUNTER — Other Ambulatory Visit: Payer: Self-pay | Admitting: Obstetrics

## 2020-05-05 DIAGNOSIS — Z3689 Encounter for other specified antenatal screening: Secondary | ICD-10-CM

## 2020-05-18 ENCOUNTER — Other Ambulatory Visit: Payer: Self-pay

## 2020-05-18 ENCOUNTER — Ambulatory Visit (HOSPITAL_BASED_OUTPATIENT_CLINIC_OR_DEPARTMENT_OTHER): Payer: Medicaid Other | Admitting: Maternal & Fetal Medicine

## 2020-05-18 ENCOUNTER — Ambulatory Visit: Payer: Medicaid Other | Attending: Obstetrics

## 2020-05-18 ENCOUNTER — Other Ambulatory Visit: Payer: Self-pay | Admitting: Obstetrics

## 2020-05-18 DIAGNOSIS — Z9889 Other specified postprocedural states: Secondary | ICD-10-CM

## 2020-05-18 DIAGNOSIS — O358XX1 Maternal care for other (suspected) fetal abnormality and damage, fetus 1: Secondary | ICD-10-CM | POA: Diagnosis not present

## 2020-05-18 DIAGNOSIS — Z3689 Encounter for other specified antenatal screening: Secondary | ICD-10-CM

## 2020-05-18 DIAGNOSIS — O35EXX0 Maternal care for other (suspected) fetal abnormality and damage, fetal genitourinary anomalies, not applicable or unspecified: Secondary | ICD-10-CM

## 2020-05-18 DIAGNOSIS — O43192 Other malformation of placenta, second trimester: Secondary | ICD-10-CM | POA: Diagnosis not present

## 2020-05-18 DIAGNOSIS — Q614 Renal dysplasia: Secondary | ICD-10-CM | POA: Diagnosis not present

## 2020-05-18 DIAGNOSIS — O358XX0 Maternal care for other (suspected) fetal abnormality and damage, not applicable or unspecified: Secondary | ICD-10-CM | POA: Diagnosis not present

## 2020-05-18 DIAGNOSIS — O26832 Pregnancy related renal disease, second trimester: Secondary | ICD-10-CM | POA: Diagnosis present

## 2020-05-18 DIAGNOSIS — Z3A22 22 weeks gestation of pregnancy: Secondary | ICD-10-CM | POA: Diagnosis not present

## 2020-05-18 DIAGNOSIS — Z3402 Encounter for supervision of normal first pregnancy, second trimester: Secondary | ICD-10-CM

## 2020-05-18 NOTE — Progress Notes (Signed)
MFM Consult note  This patient was seen for a detailed fetal anatomy scan due to a left cystic kidney that was noted during her prior ultrasound exam.  She denies any significant past medical history and denies any problems in her current pregnancy.  She reports one prior uncomplicated vaginal delivery.  She has declined all screening tests for fetal aneuploidy in her current pregnancy.  She was informed that the fetal growth and amniotic fluid level were appropriate for her gestational age.   On today's exam, an intracardiac echogenic focus was noted in the left ventricle of the fetal heart.  The small association between an echogenic focus and Down syndrome was discussed. Due to the echogenic focus noted today, the patient was offered and declined an amniocentesis today for definitive diagnosis of fetal aneuploidy.  She was also offered and declined to have a screening test for fetal aneuploidy drawn today.    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  The left kidney appears enlarged and echogenic with multiple large and small cysts.  The right fetal kidney appeared within normal limits.  A normal-appearing filled fetal bladder was also noted.    The patient was advised that a multicystic dysplastic kidney may be nonfunctional.  She was reassured that as the right kidney appeared within normal limits and as there was a normal-appearing filled fetal bladder noted today, that at least one or both of the kidneys are functioning normally.  She was advised that her baby will require further imaging studies and work up after birth to determine if the left kidney is functional and to determine if any further treatment is necessary.   The patient was informed that anomalies may be missed due to technical limitations. If the fetus is in a suboptimal position or maternal habitus is increased, visualization of the fetus in the maternal uterus may be impaired.  A marginal  placental cord insertion was also noted on today's exam.  The small association of fetal growth restriction due to the marginal placental cord insertion was discussed.    Due to the left multicystic dysplastic kidney and a marginal placental cord insertion, we will continue to follow her with serial growth ultrasounds.    A follow-up exam was scheduled in 4 weeks.  A total of 30 minutes was spent counseling and coordinating the care for this patient.

## 2020-05-28 ENCOUNTER — Encounter: Payer: Self-pay | Admitting: Obstetrics

## 2020-05-28 DIAGNOSIS — O35EXX0 Maternal care for other (suspected) fetal abnormality and damage, fetal genitourinary anomalies, not applicable or unspecified: Secondary | ICD-10-CM | POA: Insufficient documentation

## 2020-05-28 DIAGNOSIS — O358XX0 Maternal care for other (suspected) fetal abnormality and damage, not applicable or unspecified: Secondary | ICD-10-CM | POA: Insufficient documentation

## 2020-06-01 ENCOUNTER — Ambulatory Visit (INDEPENDENT_AMBULATORY_CARE_PROVIDER_SITE_OTHER): Payer: Medicaid Other | Admitting: Obstetrics and Gynecology

## 2020-06-01 ENCOUNTER — Other Ambulatory Visit: Payer: Self-pay

## 2020-06-01 VITALS — BP 130/72 | Ht 69.0 in | Wt 179.4 lb

## 2020-06-01 DIAGNOSIS — Z3402 Encounter for supervision of normal first pregnancy, second trimester: Secondary | ICD-10-CM

## 2020-06-01 DIAGNOSIS — O358XX0 Maternal care for other (suspected) fetal abnormality and damage, not applicable or unspecified: Secondary | ICD-10-CM

## 2020-06-01 DIAGNOSIS — O35EXX0 Maternal care for other (suspected) fetal abnormality and damage, fetal genitourinary anomalies, not applicable or unspecified: Secondary | ICD-10-CM

## 2020-06-01 DIAGNOSIS — Z3A24 24 weeks gestation of pregnancy: Secondary | ICD-10-CM

## 2020-06-01 LAB — POCT URINALYSIS DIPSTICK OB
Glucose, UA: NEGATIVE
POC,PROTEIN,UA: NEGATIVE

## 2020-06-01 NOTE — Progress Notes (Signed)
Routine Prenatal Care Visit  Subjective  Sharon Lambert is a 29 y.o. [phone] at [redacted]w[redacted]d being seen today for ongoing prenatal care.  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  ----------------------------------------------------------------------------------- Patient reports no complaints.   Contractions: Not present. Vag. Bleeding: None.  Movement: Present. Denies leaking of fluid.  ----------------------------------------------------------------------------------- The following portions of the patient's history were reviewed and updated as appropriate: allergies, current medications, past family history, past medical history, past social history, past surgical history and problem list. Problem list updated.   Objective  Blood pressure 130/72, height 5\' 9"  (1.753 m), weight 179 lb 6.4 oz (81.4 kg), last menstrual period 12/13/2019. Pregravid weight 160 lb (72.6 kg) Total Weight Gain 19 lb 6.4 oz (8.8 kg) Urinalysis:      Fetal Status: Fetal Heart Rate (bpm): 140 Fundal Height: 25 cm Movement: Present     General:  Alert, oriented and cooperative. Patient is in no acute distress.  Skin: Skin is warm and dry. No rash noted.   Cardiovascular: Normal heart rate noted  Respiratory: Normal respiratory effort, no problems with respiration noted  Abdomen: Soft, gravid, appropriate for gestational age. Pain/Pressure: Absent     Pelvic:  Cervical exam deferred        Extremities: Normal range of motion.     ental Status: Normal mood and affect. Normal behavior. Normal judgment and thought content.     Assessment   28 y.o. 02/12/2020 at [redacted]w[redacted]d by  09/18/2020, by Last Menstrual Period presenting  for routine prenatal visit  Plan   Pregnant Problems (from 03/02/20 to present)    Problem Noted Resolved   Supervision of normal pregnancy 03/10/2020 by 03/12/2020, CNM No   Overview Addendum 05/24/2020 10:29 AM by 07/24/2020, CNM    [facility]  Dating Lmp=14wk Sharon Lambert Blood type: B/Positive/-- (05/28 1600)   Genetic Screen  NIPS: normal xx Antibody:Negative (05/28 1600)  Anatomic 11-02-1976  fetal cystic kidney per MFM; monthly scans Rubella: 3.43 (05/28 1600)  Varicella: immune  GTT Third trimester:  RPR: Non Reactive (05/28 1600)   Rhogam Not needed HBsAg: Negative (05/28 1600)   Vaccines TDAP:                       Flu Shot: HIV: Non Reactive (05/28 1600)   Baby Food plans formula- reviewed breastfeeding education            GBS:   Contraception  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    - discussed COVID vaccination in pregnancy as well as SMFM, ACOG, and CDC recommendations.  If not vaccinated we discussed MAB infusion if she was to test positive.    Return in about 4 weeks (around 06/29/2020) for ROB and 28  week labs.  Vena Austria, MD, Evern Core [facility], [facility] 06/01/2020, 2:25 PM

## 2020-06-01 NOTE — Addendum Note (Signed)
Addended by: Clement Husbands A on: 06/01/2020 02:58 PM   Modules accepted: Orders

## 2020-06-12 ENCOUNTER — Other Ambulatory Visit: Payer: Self-pay | Admitting: Obstetrics and Gynecology

## 2020-06-12 DIAGNOSIS — O43199 Other malformation of placenta, unspecified trimester: Secondary | ICD-10-CM

## 2020-06-12 DIAGNOSIS — O35EXX0 Maternal care for other (suspected) fetal abnormality and damage, fetal genitourinary anomalies, not applicable or unspecified: Secondary | ICD-10-CM

## 2020-06-15 ENCOUNTER — Ambulatory Visit: Payer: Medicaid Other | Attending: Maternal & Fetal Medicine

## 2020-06-15 ENCOUNTER — Other Ambulatory Visit: Payer: Self-pay

## 2020-06-15 DIAGNOSIS — Z3A26 26 weeks gestation of pregnancy: Secondary | ICD-10-CM | POA: Insufficient documentation

## 2020-06-15 DIAGNOSIS — O35EXX0 Maternal care for other (suspected) fetal abnormality and damage, fetal genitourinary anomalies, not applicable or unspecified: Secondary | ICD-10-CM

## 2020-06-15 DIAGNOSIS — Q614 Renal dysplasia: Secondary | ICD-10-CM | POA: Insufficient documentation

## 2020-06-15 DIAGNOSIS — O43192 Other malformation of placenta, second trimester: Secondary | ICD-10-CM | POA: Diagnosis not present

## 2020-06-15 DIAGNOSIS — Z3402 Encounter for supervision of normal first pregnancy, second trimester: Secondary | ICD-10-CM

## 2020-06-15 DIAGNOSIS — O358XX0 Maternal care for other (suspected) fetal abnormality and damage, not applicable or unspecified: Secondary | ICD-10-CM | POA: Insufficient documentation

## 2020-06-15 DIAGNOSIS — O43199 Other malformation of placenta, unspecified trimester: Secondary | ICD-10-CM

## 2020-06-15 DIAGNOSIS — O3441 Maternal care for other abnormalities of cervix, first trimester: Secondary | ICD-10-CM

## 2020-06-29 ENCOUNTER — Ambulatory Visit (INDEPENDENT_AMBULATORY_CARE_PROVIDER_SITE_OTHER): Payer: Medicaid Other | Admitting: Obstetrics

## 2020-06-29 ENCOUNTER — Other Ambulatory Visit: Payer: Self-pay

## 2020-06-29 ENCOUNTER — Other Ambulatory Visit: Payer: Medicaid Other

## 2020-06-29 VITALS — BP 114/68 | Ht 68.0 in | Wt 179.6 lb

## 2020-06-29 DIAGNOSIS — Z3402 Encounter for supervision of normal first pregnancy, second trimester: Secondary | ICD-10-CM

## 2020-06-29 DIAGNOSIS — Z3A28 28 weeks gestation of pregnancy: Secondary | ICD-10-CM

## 2020-06-29 NOTE — Progress Notes (Signed)
Routine Prenatal Care Visit  Subjective  Sharon Lambert is a 29 y.o. [phone] at [redacted]w[redacted]d being seen today for ongoing prenatal care.  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  ----------------------------------------------------------------------------------- Patient reports no complaints.  She continues to see the MFM monthly for ultrasound to check the baby's kidneys. Still undecided on a name for her daughter. Contractions: Not present. Vag. Bleeding: None.  Movement: Present. Leaking Fluid denies.  ----------------------------------------------------------------------------------- The following portions of the patient's history were reviewed and updated as appropriate: allergies, current medications, past family history, past medical history, past social history, past surgical history and problem list. Problem list updated.  Objective  Blood pressure 114/68, height 5\' 8"  (1.727 m), weight 179 lb 9.6 oz (81.5 kg), last menstrual period 12/13/2019. Pregravid weight 160 lb (72.6 kg) Total Weight Gain 19 lb 9.6 oz (8.891 kg) Urinalysis: Urine Protein    Urine Glucose    Fetal Status:     Movement: Present     General:  Alert, oriented and cooperative. Patient is in no acute distress.  Skin: Skin is warm and dry. No rash noted.   Cardiovascular: Normal heart rate noted  Respiratory: Normal respiratory effort, no problems with respiration noted  Abdomen: Soft, gravid, appropriate for gestational age. Pain/Pressure: Absent     Pelvic:  Cervical exam deferred        Extremities: Normal range of motion.     Mental Status: Normal mood and affect. Normal behavior. Normal judgment and thought  content.   Assessment   29 y.o. 38 at [redacted]w[redacted]d by  09/18/2020, by Last Menstrual Period presenting for routine prenatal visit  Plan   Pregnant Problems (from 03/02/20 to present)    Problem Noted Resolved   Supervision of normal pregnancy 03/10/2020 by 03/12/2020, CNM No   Overview Addendum 05/24/2020 10:29 AM by 07/24/2020, CNM    [facility]  Dating Lmp=14wk [name] Blood type: B/Positive/-- (05/28 1600)   Genetic Screen  NIPS: normal xx Antibody:Negative (05/28 1600)  Anatomic 11-02-1976  fetal cystic kidney per MFM; monthly scans Rubella: 3.43 (05/28 1600)  Varicella: immune  GTT Third trimester:  RPR: Non Reactive (05/28 1600)   Rhogam Not needed HBsAg: Negative (05/28 1600)   Vaccines TDAP:                       Flu Shot: HIV: Non Reactive (05/28 1600)   Baby Food plans formula- reviewed breastfeeding education            GBS:   Contraception  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 Please refer to After Visit Summary for other counseling recommendations.   Return in about 2 weeks (around 07/13/2020) for return OB.  07/15/2020, CNM  06/29/2020 3:32 PM

## 2020-06-30 LAB — 28 WEEK RH+PANEL
Basophils Absolute: 0 10*3/uL (ref 0.0–0.2)
Basos: 0 %
EOS (ABSOLUTE): 0.1 10*3/uL (ref 0.0–0.4)
Eos: 1 %
Gestational Diabetes Screen: 120 mg/dL (ref 65–139)
HIV Screen 4th Generation wRfx: NONREACTIVE
Hematocrit: 32.6 % — ABNORMAL LOW (ref 34.0–46.6)
Hemoglobin: 11.3 g/dL (ref 11.1–15.9)
Immature Grans (Abs): 0 10*3/uL (ref 0.0–0.1)
Immature Granulocytes: 0 %
Lymphocytes Absolute: 1.8 10*3/uL (ref 0.7–3.1)
Lymphs: 21 %
MCH: 32.1 pg (ref 26.6–33.0)
MCHC: 34.7 g/dL (ref 31.5–35.7)
MCV: 93 fL (ref 79–97)
Monocytes Absolute: 0.5 10*3/uL (ref 0.1–0.9)
Monocytes: 6 %
Neutrophils Absolute: 6.2 10*3/uL (ref 1.4–7.0)
Neutrophils: 72 %
Platelets: 212 10*3/uL (ref 150–450)
RBC: 3.52 x10E6/uL — ABNORMAL LOW (ref 3.77–5.28)
RDW: 11.2 % — ABNORMAL LOW (ref 11.7–15.4)
RPR Ser Ql: NONREACTIVE
WBC: 8.6 10*3/uL (ref 3.4–10.8)

## 2020-07-10 ENCOUNTER — Other Ambulatory Visit: Payer: Self-pay | Admitting: Obstetrics and Gynecology

## 2020-07-10 DIAGNOSIS — O43192 Other malformation of placenta, second trimester: Secondary | ICD-10-CM

## 2020-07-10 DIAGNOSIS — O35EXX0 Maternal care for other (suspected) fetal abnormality and damage, fetal genitourinary anomalies, not applicable or unspecified: Secondary | ICD-10-CM

## 2020-07-13 ENCOUNTER — Ambulatory Visit: Payer: Medicaid Other | Attending: Maternal & Fetal Medicine

## 2020-07-13 ENCOUNTER — Other Ambulatory Visit: Payer: Self-pay

## 2020-07-13 ENCOUNTER — Ambulatory Visit (INDEPENDENT_AMBULATORY_CARE_PROVIDER_SITE_OTHER): Payer: Medicaid Other | Admitting: Obstetrics

## 2020-07-13 VITALS — BP 120/76 | Wt 180.0 lb

## 2020-07-13 DIAGNOSIS — O358XX0 Maternal care for other (suspected) fetal abnormality and damage, not applicable or unspecified: Secondary | ICD-10-CM | POA: Diagnosis not present

## 2020-07-13 DIAGNOSIS — O43192 Other malformation of placenta, second trimester: Secondary | ICD-10-CM

## 2020-07-13 DIAGNOSIS — Q614 Renal dysplasia: Secondary | ICD-10-CM | POA: Diagnosis not present

## 2020-07-13 DIAGNOSIS — O26893 Other specified pregnancy related conditions, third trimester: Secondary | ICD-10-CM | POA: Diagnosis present

## 2020-07-13 DIAGNOSIS — O35EXX0 Maternal care for other (suspected) fetal abnormality and damage, fetal genitourinary anomalies, not applicable or unspecified: Secondary | ICD-10-CM

## 2020-07-13 DIAGNOSIS — Z23 Encounter for immunization: Secondary | ICD-10-CM | POA: Diagnosis not present

## 2020-07-13 DIAGNOSIS — Z3A30 30 weeks gestation of pregnancy: Secondary | ICD-10-CM

## 2020-07-13 DIAGNOSIS — Z3403 Encounter for supervision of normal first pregnancy, third trimester: Secondary | ICD-10-CM

## 2020-07-13 DIAGNOSIS — O3441 Maternal care for other abnormalities of cervix, first trimester: Secondary | ICD-10-CM

## 2020-07-13 DIAGNOSIS — O43193 Other malformation of placenta, third trimester: Secondary | ICD-10-CM | POA: Diagnosis not present

## 2020-07-13 DIAGNOSIS — Z3480 Encounter for supervision of other normal pregnancy, unspecified trimester: Secondary | ICD-10-CM

## 2020-07-13 LAB — POCT URINALYSIS DIPSTICK OB
Glucose, UA: NEGATIVE
POC,PROTEIN,UA: NEGATIVE

## 2020-07-13 NOTE — Progress Notes (Signed)
No concerns.rj 

## 2020-07-13 NOTE — Progress Notes (Signed)
Routine Prenatal Care Visit  Subjective  Sharon Lambert is a 29 y.o. [phone] at [redacted]w[redacted]d being seen today for ongoing prenatal care.  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  ----------------------------------------------------------------------------------- Patient reports no complaints.   She wants to try to breastfeedi longer this time. Nurse her first for just a week.  .  .   Pincus Large Fluid denies.  ----------------------------------------------------------------------------------- The following portions of the patient's history were reviewed and updated as appropriate: allergies, current medications, past family history, past medical history, past social history, past surgical history and problem list. Problem list updated.  Objective  Blood pressure 120/76, weight 180 lb (81.6 kg), last menstrual period 12/13/2019. Pregravid weight 160 lb (72.6 kg) Total Weight Gain 20 lb (9.072 kg) Urinalysis: Urine Protein Negative  Urine Glucose Negative  Fetal Status:           General:  Alert, oriented and cooperative. Patient is in no acute distress.  Skin: Skin is warm and dry. No rash noted.   Cardiovascular: Normal heart rate noted  Respiratory: Normal respiratory effort, no problems with respiration noted  Abdomen: Soft, gravid, appropriate for gestational age.       Pelvic:  Cervical exam deferred        Extremities: Normal range of motion.     Mental Status: Normal mood and affect. Normal behavior. Normal judgment and thought content.   Assessment   29 y.o. [id number] at [redacted]w[redacted]d by  09/18/2020, by Last Menstrual Period presenting for routine prenatal visit  Plan   Pregnant Problems  (from 03/02/20 to present)    Problem Noted Resolved   Supervision of normal pregnancy 03/10/2020 by Tresea Mall, CNM No   Overview Addendum 06/30/2020  1:46 PM by Vena Austria, MD    [facility]  Dating Lmp=14wk [place] Blood type: B/Positive/-- (05/28 1600)   Genetic Screen  NIPS: normal xx Antibody:Negative (05/28 1600)  Anatomic US  fetal cystic kidney per MFM; monthly scans Rubella: 3.43 (05/28 1600)  Varicella: immune  GTT Third trimester: 120 RPR: Non Reactive (05/28 1600)   Rhogam Not needed HBsAg: Negative (05/28 1600)   Vaccines TDAP:                       Flu Shot: HIV: Non Reactive (05/28 1600)   Baby Food plans formula- reviewed breastfeeding education            GBS:   Contraception  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 Please refer to After Visit Summary for other counseling recommendations.  Tdap given today. COVID vaccine discussed and she plans on getting the vaccine.  Return in about 2 weeks (around 07/27/2020) for return OB.  [name], CNM  07/13/2020 2:50 PM

## 2020-07-13 NOTE — Addendum Note (Signed)
Addended by: Loran Senters D on: 07/13/2020 04:54 PM   Modules accepted: Orders

## 2020-07-27 ENCOUNTER — Ambulatory Visit (INDEPENDENT_AMBULATORY_CARE_PROVIDER_SITE_OTHER): Payer: Medicaid Other | Admitting: Advanced Practice Midwife

## 2020-07-27 ENCOUNTER — Other Ambulatory Visit: Payer: Self-pay

## 2020-07-27 ENCOUNTER — Encounter: Payer: Self-pay | Admitting: Advanced Practice Midwife

## 2020-07-27 VITALS — BP 110/68 | Wt 182.0 lb

## 2020-07-27 DIAGNOSIS — Z3483 Encounter for supervision of other normal pregnancy, third trimester: Secondary | ICD-10-CM

## 2020-07-27 DIAGNOSIS — Z3A32 32 weeks gestation of pregnancy: Secondary | ICD-10-CM

## 2020-07-27 NOTE — Progress Notes (Signed)
°  Routine Prenatal Care Visit  Subjective  Sharon Lambert is a 29 y.o. [phone] at [redacted]w[redacted]d being seen today for ongoing prenatal care.  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  ----------------------------------------------------------------------------------- Patient reports no complaints.   Contractions: Not present. Vag. Bleeding: None.  Movement: Present. Leaking Fluid denies.  ----------------------------------------------------------------------------------- The following portions of the patient's history were reviewed and updated as appropriate: allergies, current medications, past family history, past medical history, past social history, past surgical history and problem list. Problem list updated.  Objective  Blood pressure 110/68, weight 182 lb (82.6 kg), last menstrual period 12/13/2019. Pregravid weight 160 lb (72.6 kg) Total Weight Gain 22 lb (9.979 kg) Urinalysis: Urine Protein    Urine Glucose    Fetal Status: Fetal Heart Rate (bpm): 150 Fundal Height: 32 cm Movement: Present     General:  Alert, oriented and cooperative. Patient is in no acute distress.  Skin: Skin is warm and dry. No rash noted.   Cardiovascular: Normal heart rate noted  Respiratory: Normal respiratory effort, no problems with respiration noted  Abdomen: Soft, gravid, appropriate for gestational age. Pain/Pressure: Absent     Pelvic:  Cervical exam deferred        Extremities: Normal range of motion.  Edema: None  Mental Status: Normal mood and affect. Normal behavior. Normal judgment and thought content.   Assessment   29 y.o. [id number] at [redacted]w[redacted]d by  09/18/2020, by Last Menstrual Period presenting for  routine prenatal visit  Plan   Pregnant Problems (from 03/02/20 to present)    Problem Noted Resolved   Supervision of normal pregnancy 03/10/2020 by Tresea Mall, CNM No   Overview Addendum 06/30/2020  1:46 PM by Vena Austria, MD    [facility] Prenatal Labs  Dating Lmp=14wk [place] Blood type: B/Positive/-- (05/28 1600)   Genetic Screen  NIPS: normal xx Antibody:Negative (05/28 1600)  Anatomic US  fetal cystic kidney per MFM; monthly scans Rubella: 3.43 (05/28 1600)  Varicella: immune  GTT Third trimester: 120 RPR: Non Reactive (05/28 1600)   Rhogam Not needed HBsAg: Negative (05/28 1600)   Vaccines TDAP:                       Flu Shot: HIV: Non Reactive (05/28 1600)   Baby Food plans formula- reviewed breastfeeding education            GBS:   Contraception  Pap: 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   Return in about 2 weeks (around 08/10/2020) for rob.  Tresea Mall, CNM 07/27/2020 2:50 PM

## 2020-08-03 ENCOUNTER — Other Ambulatory Visit: Payer: Self-pay | Admitting: Obstetrics and Gynecology

## 2020-08-03 DIAGNOSIS — O35EXX0 Maternal care for other (suspected) fetal abnormality and damage, fetal genitourinary anomalies, not applicable or unspecified: Secondary | ICD-10-CM

## 2020-08-03 DIAGNOSIS — O43199 Other malformation of placenta, unspecified trimester: Secondary | ICD-10-CM

## 2020-08-03 DIAGNOSIS — O358XX0 Maternal care for other (suspected) fetal abnormality and damage, not applicable or unspecified: Secondary | ICD-10-CM

## 2020-08-10 ENCOUNTER — Ambulatory Visit (INDEPENDENT_AMBULATORY_CARE_PROVIDER_SITE_OTHER): Payer: Medicaid Other | Admitting: Obstetrics & Gynecology

## 2020-08-10 ENCOUNTER — Other Ambulatory Visit: Payer: Self-pay

## 2020-08-10 ENCOUNTER — Ambulatory Visit: Payer: Medicaid Other | Attending: Maternal & Fetal Medicine

## 2020-08-10 ENCOUNTER — Encounter: Payer: Self-pay | Admitting: Obstetrics & Gynecology

## 2020-08-10 VITALS — BP 120/80 | Wt 187.0 lb

## 2020-08-10 DIAGNOSIS — Z23 Encounter for immunization: Secondary | ICD-10-CM

## 2020-08-10 DIAGNOSIS — Z3483 Encounter for supervision of other normal pregnancy, third trimester: Secondary | ICD-10-CM

## 2020-08-10 DIAGNOSIS — O358XX0 Maternal care for other (suspected) fetal abnormality and damage, not applicable or unspecified: Secondary | ICD-10-CM

## 2020-08-10 DIAGNOSIS — Z3A34 34 weeks gestation of pregnancy: Secondary | ICD-10-CM

## 2020-08-10 DIAGNOSIS — O43199 Other malformation of placenta, unspecified trimester: Secondary | ICD-10-CM

## 2020-08-10 DIAGNOSIS — O35EXX0 Maternal care for other (suspected) fetal abnormality and damage, fetal genitourinary anomalies, not applicable or unspecified: Secondary | ICD-10-CM

## 2020-08-10 DIAGNOSIS — O43193 Other malformation of placenta, third trimester: Secondary | ICD-10-CM | POA: Insufficient documentation

## 2020-08-10 DIAGNOSIS — Z9889 Other specified postprocedural states: Secondary | ICD-10-CM

## 2020-08-10 DIAGNOSIS — O3441 Maternal care for other abnormalities of cervix, first trimester: Secondary | ICD-10-CM

## 2020-08-10 LAB — POCT URINALYSIS DIPSTICK OB
Glucose, UA: NEGATIVE
POC,PROTEIN,UA: NEGATIVE

## 2020-08-10 NOTE — Progress Notes (Signed)
°  Subjective  Fetal Movement? yes Contractions? no Leaking Fluid? no Vaginal Bleeding? no  Objective  BP 120/80    Wt 187 lb (84.8 kg)    LMP 12/13/2019 (Exact Date)    BMI 28.43 kg/m  General: NAD Pumonary: no increased work of breathing Abdomen: gravid, non-tender Extremities: no edema Psychiatric: mood appropriate, affect full  Assessment  29 y.o. [id number] at [redacted]w[redacted]d by  09/18/2020, by Last Menstrual Period presenting for routine prenatal visit  Plan   Problem List Items Addressed This Visit      Other   Supervision of normal pregnancy    Other Visit Diagnoses    Need for immunization against influenza    -  Primary   Relevant Orders   Flu Vaccine QUAD 36+ mos IM (Completed)   [redacted] weeks gestation of pregnancy       Relevant Orders   POC Urinalysis Dipstick OB (Completed)   Polycystic kidney disease of fetus affecting management of mother in singleton pregnancy, antepartum        [place] today at MFM, normal growth; marginal insertion of cord present [facility] PTL precautions (prior 36 weeks spon PTL/ PTD)    [facility] Prenatal Labs  Dating Lmp=14wk [place] Blood type: B/Positive/-- (05/28 1600)   Genetic Screen  NIPS: normal xx Antibody:Negative (05/28 1600)  Anatomic US  fetal cystic kidney per MFM; monthly scans Rubella: 3.43 (05/28 1600)  Varicella: immune  GTT Third trimester: 120 RPR: Non Reactive (05/28 1600)   Rhogam Not needed HBsAg: Negative (05/28 1600)   Vaccines TDAP:                       Flu Shot: HIV: Non Reactive (05/28 1600)   Baby Food plans formula- reviewed breastfeeding education            GBS: nv  Contraception IUD or Depo Pap: 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

## 2020-08-10 NOTE — Patient Instructions (Signed)
Braxton Hicks Contractions °Contractions of the uterus can occur throughout pregnancy, but they are not always a sign that you are in labor. You may have practice contractions called Braxton Hicks contractions. These false labor contractions are sometimes confused with true labor. °What are Braxton Hicks contractions? °Braxton Hicks contractions are tightening movements that occur in the muscles of the uterus before labor. Unlike true labor contractions, these contractions do not result in opening (dilation) and thinning of the cervix. Toward the end of pregnancy (32-34 weeks), Braxton Hicks contractions can happen more often and may become stronger. These contractions are sometimes difficult to tell apart from true labor because they can be very uncomfortable. You should not feel embarrassed if you go to the hospital with false labor. °Sometimes, the only way to tell if you are in true labor is for your health care provider to look for changes in the cervix. The health care provider will do a physical exam and may monitor your contractions. If you are not in true labor, the exam should show that your cervix is not dilating and your water has not broken. °If there are no other health problems associated with your pregnancy, it is completely safe for you to be sent home with false labor. You may continue to have Braxton Hicks contractions until you go into true labor. °How to tell the difference between true labor and false labor °True labor °· Contractions last 30-70 seconds. °· Contractions become very regular. °· Discomfort is usually felt in the top of the uterus, and it spreads to the lower abdomen and low back. °· Contractions do not go away with walking. °· Contractions usually become more intense and increase in frequency. °· The cervix dilates and gets thinner. °False labor °· Contractions are usually shorter and not as strong as true labor contractions. °· Contractions are usually irregular. °· Contractions  are often felt in the front of the lower abdomen and in the groin. °· Contractions may go away when you walk around or change positions while lying down. °· Contractions get weaker and are shorter-lasting as time goes on. °· The cervix usually does not dilate or become thin. °Follow these instructions at home: ° °· Take over-the-counter and prescription medicines only as told by your health care provider. °· Keep up with your usual exercises and follow other instructions from your health care provider. °· Eat and drink lightly if you think you are going into labor. °· If Braxton Hicks contractions are making you uncomfortable: °? Change your position from lying down or resting to walking, or change from walking to resting. °? Sit and rest in a tub of warm water. °? Drink enough fluid to keep your urine pale yellow. Dehydration may cause these contractions. °? Do slow and deep breathing several times an hour. °· Keep all follow-up prenatal visits as told by your health care provider. This is important. °Contact a health care provider if: °· You have a fever. °· You have continuous pain in your abdomen. °Get help right away if: °· Your contractions become stronger, more regular, and closer together. °· You have fluid leaking or gushing from your vagina. °· You pass blood-tinged mucus (bloody show). °· You have bleeding from your vagina. °· You have low back pain that you never had before. °· You feel your baby’s head pushing down and causing pelvic pressure. °· Your baby is not moving inside you as much as it used to. °Summary °· Contractions that occur before labor are   called Braxton Hicks contractions, false labor, or practice contractions. °· Braxton Hicks contractions are usually shorter, weaker, farther apart, and less regular than true labor contractions. True labor contractions usually become progressively stronger and regular, and they become more frequent. °· Manage discomfort from Braxton Hicks contractions  by changing position, resting in a warm bath, drinking plenty of water, or practicing deep breathing. °This information is not intended to replace advice given to you by your health care provider. Make sure you discuss any questions you have with your health care provider. °Document Revised: 09/12/2017 Document Reviewed: 02/13/2017 °Elsevier Patient Education © 2020 [employer] ° °

## 2020-08-24 ENCOUNTER — Encounter: Payer: Self-pay | Admitting: Advanced Practice Midwife

## 2020-08-24 ENCOUNTER — Other Ambulatory Visit: Payer: Self-pay

## 2020-08-24 ENCOUNTER — Other Ambulatory Visit (HOSPITAL_COMMUNITY)
Admission: RE | Admit: 2020-08-24 | Discharge: 2020-08-24 | Disposition: A | Discharge status: Home / Self Care | Payer: Medicaid Other | Source: Ambulatory Visit | Attending: Advanced Practice Midwife | Admitting: Advanced Practice Midwife

## 2020-08-24 ENCOUNTER — Ambulatory Visit (INDEPENDENT_AMBULATORY_CARE_PROVIDER_SITE_OTHER): Payer: Medicaid Other | Admitting: Advanced Practice Midwife

## 2020-08-24 VITALS — BP 122/70 | Ht 68.0 in | Wt 192.4 lb

## 2020-08-24 DIAGNOSIS — Z3A36 36 weeks gestation of pregnancy: Secondary | ICD-10-CM

## 2020-08-24 DIAGNOSIS — Z113 Encounter for screening for infections with a predominantly sexual mode of transmission: Secondary | ICD-10-CM

## 2020-08-24 DIAGNOSIS — Z3685 Encounter for antenatal screening for Streptococcus B: Secondary | ICD-10-CM | POA: Diagnosis not present

## 2020-08-24 DIAGNOSIS — Z3483 Encounter for supervision of other normal pregnancy, third trimester: Secondary | ICD-10-CM

## 2020-08-24 NOTE — Progress Notes (Signed)
  Routine Prenatal Care Visit  Subjective  Sharon Lambert is a 29 y.o. [phone] at [redacted]w[redacted]d being seen today for ongoing prenatal care.  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  ----------------------------------------------------------------------------------- Patient reports no complaints.   Contractions: Not present. Vag. Bleeding: None.  Movement: Present. Leaking Fluid denies.  ----------------------------------------------------------------------------------- The following portions of the patient's history were reviewed and updated as appropriate: allergies, current medications, past family history, past medical history, past social history, past surgical history and problem list. Problem list updated.  Objective  Blood pressure 122/70, height 5\' 8"  (1.727 m), weight 192 lb 6.4 oz (87.3 kg), last menstrual period 12/13/2019. Pregravid weight 160 lb (72.6 kg) Total Weight Gain 32 lb 6.4 oz (14.7 kg) Urinalysis: Urine Protein    Urine Glucose    Fetal Status: Fetal Heart Rate (bpm): 145 Fundal Height: 36 cm Movement: Present     General:  Alert, oriented and cooperative. Patient is in no acute distress.  Skin: Skin is warm and dry. No rash noted.   Cardiovascular: Normal heart rate noted  Respiratory: Normal respiratory effort, no problems with respiration noted  Abdomen: Soft, gravid, appropriate for gestational age. Pain/Pressure: Absent     Pelvic:  Cervical exam performed Dilation: Fingertip Effacement (%): 50 Station: Ballotable, posterior  Extremities: Normal range of motion.  Edema: None  Mental Status: Normal mood and affect. Normal behavior. Normal judgment and thought content.     Assessment   29 y.o. 31 at [redacted]w[redacted]d by  09/18/2020, by Last Menstrual Period presenting for routine prenatal visit  Plan   Pregnant Problems (from 03/02/20 to present)    Problem Noted Resolved   Supervision of normal pregnancy 03/10/2020 by 03/12/2020, CNM No   Overview Addendum 06/30/2020  1:46 PM by 07/02/2020, MD    [facility] Prenatal Labs  Dating Lmp=14wk Vena [place] Blood type: B/Positive/-- (05/28 1600)   Genetic Screen  NIPS: normal xx Antibody:Negative (05/28 1600)  Anatomic 11-02-1976  fetal cystic kidney per MFM; monthly scans Rubella: 3.43 (05/28 1600)  Varicella: immune  GTT Third trimester: 120 RPR: Non Reactive (05/28 1600)   Rhogam Not needed HBsAg: Negative (05/28 1600)   Vaccines TDAP:                       Flu Shot: HIV: Non Reactive (05/28 1600)   Baby Food plans formula- reviewed breastfeeding education            GBS:   Contraception  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   Return in about 1 week (around 08/31/2020) for rob.  09/02/2020, CNM 08/24/2020 4:53 PM

## 2020-08-28 LAB — CERVICOVAGINAL ANCILLARY ONLY
Chlamydia: NEGATIVE
Comment: NEGATIVE
Comment: NEGATIVE
Comment: NORMAL
Neisseria Gonorrhea: NEGATIVE
Trichomonas: NEGATIVE

## 2020-08-28 LAB — STREP GP B NAA: Strep Gp B NAA: NEGATIVE

## 2020-08-31 ENCOUNTER — Other Ambulatory Visit: Payer: Self-pay | Admitting: Obstetrics and Gynecology

## 2020-08-31 ENCOUNTER — Other Ambulatory Visit: Payer: Self-pay

## 2020-08-31 ENCOUNTER — Encounter: Payer: Self-pay | Admitting: Obstetrics and Gynecology

## 2020-08-31 ENCOUNTER — Ambulatory Visit (INDEPENDENT_AMBULATORY_CARE_PROVIDER_SITE_OTHER): Payer: Medicaid Other | Admitting: Obstetrics and Gynecology

## 2020-08-31 VITALS — BP 120/72 | Ht 68.0 in | Wt 192.6 lb

## 2020-08-31 DIAGNOSIS — O35EXX0 Maternal care for other (suspected) fetal abnormality and damage, fetal genitourinary anomalies, not applicable or unspecified: Secondary | ICD-10-CM

## 2020-08-31 DIAGNOSIS — Z3A37 37 weeks gestation of pregnancy: Secondary | ICD-10-CM

## 2020-08-31 DIAGNOSIS — O358XX0 Maternal care for other (suspected) fetal abnormality and damage, not applicable or unspecified: Secondary | ICD-10-CM

## 2020-08-31 DIAGNOSIS — Z3403 Encounter for supervision of normal first pregnancy, third trimester: Secondary | ICD-10-CM

## 2020-08-31 DIAGNOSIS — O43199 Other malformation of placenta, unspecified trimester: Secondary | ICD-10-CM

## 2020-08-31 LAB — POCT URINALYSIS DIPSTICK OB
Glucose, UA: NEGATIVE
POC,PROTEIN,UA: NEGATIVE

## 2020-08-31 NOTE — Patient Instructions (Signed)
Third Trimester of Pregnancy The third trimester is from week 28 through week 40 (months 7 through 9). The third trimester is a time when the unborn baby (fetus) is growing rapidly. At the end of the ninth month, the fetus is about 20 inches in length and weighs 6-10 pounds. Body changes during your third trimester Your body will continue to go through many changes during pregnancy. The changes vary from woman to woman. During the third trimester:  Your weight will continue to increase. You can expect to gain 25-35 pounds (11-16 kg) by the end of the pregnancy.  You may begin to get stretch marks on your hips, abdomen, and breasts.  You may urinate more often because the fetus is moving lower into your pelvis and pressing on your bladder.  You may develop or continue to have heartburn. This is caused by increased hormones that slow down muscles in the digestive tract.  You may develop or continue to have constipation because increased hormones slow digestion and cause the muscles that push waste through your intestines to relax.  You may develop hemorrhoids. These are swollen veins (varicose veins) in the rectum that can itch or be painful.  You may develop swollen, bulging veins (varicose veins) in your legs.  You may have increased body aches in the pelvis, back, or thighs. This is due to weight gain and increased hormones that are relaxing your joints.  You may have changes in your hair. These can include thickening of your hair, rapid growth, and changes in texture. Some women also have hair loss during or after pregnancy, or hair that feels dry or thin. Your hair will most likely return to normal after your baby is born.  Your breasts will continue to grow and they will continue to become tender. A yellow fluid (colostrum) may leak from your breasts. This is the first milk you are producing for your baby.  Your belly button may stick out.  You may notice more swelling in your hands,  face, or ankles.  You may have increased tingling or numbness in your hands, arms, and legs. The skin on your belly may also feel numb.  You may feel short of breath because of your expanding uterus.  You may have more problems sleeping. This can be caused by the size of your belly, increased need to urinate, and an increase in your body's metabolism.  You may notice the fetus "dropping," or moving lower in your abdomen (lightening).  You may have increased vaginal discharge.  You may notice your joints feel loose and you may have pain around your pelvic bone. What to expect at prenatal visits You will have prenatal exams every 2 weeks until week 36. Then you will have weekly prenatal exams. During a routine prenatal visit:  You will be weighed to make sure you and the baby are growing normally.  Your blood pressure will be taken.  Your abdomen will be measured to track your baby's growth.  The fetal heartbeat will be listened to.  Any test results from the previous visit will be discussed.  You may have a cervical check near your due date to see if your cervix has softened or thinned (effaced).  You will be tested for Group B streptococcus. This happens between 35 and 37 weeks. Your health care provider may ask you:  What your birth plan is.  How you are feeling.  If you are feeling the baby move.  If you have had any abnormal   symptoms, such as leaking fluid, bleeding, severe headaches, or abdominal cramping.  If you are using any tobacco products, including cigarettes, chewing tobacco, and electronic cigarettes.  If you have any questions. Other tests or screenings that may be performed during your third trimester include:  Blood tests that check for low iron levels (anemia).  Fetal testing to check the health, activity level, and growth of the fetus. Testing is done if you have certain medical conditions or if there are problems during the pregnancy.  Nonstress test  (NST). This test checks the health of your baby to make sure there are no signs of problems, such as the baby not getting enough oxygen. During this test, a belt is placed around your belly. The baby is made to move, and its heart rate is monitored during movement. What is false labor? False labor is a condition in which you feel small, irregular tightenings of the muscles in the womb (contractions) that usually go away with rest, changing position, or drinking water. These are called Braxton Hicks contractions. Contractions may last for hours, days, or even weeks before true labor sets in. If contractions come at regular intervals, become more frequent, increase in intensity, or become painful, you should see your health care provider. What are the signs of labor?  Abdominal cramps.  Regular contractions that start at 10 minutes apart and become stronger and more frequent with time.  Contractions that start on the top of the uterus and spread down to the lower abdomen and back.  Increased pelvic pressure and dull back pain.  A watery or bloody mucus discharge that comes from the vagina.  Leaking of amniotic fluid. This is also known as your "water breaking." It could be a slow trickle or a gush. Let your health care provider know if it has a color or strange odor. If you have any of these signs, call your health care provider right away, even if it is before your due date. Follow these instructions at home: Medicines  Follow your health care provider's instructions regarding medicine use. Specific medicines may be either safe or unsafe to take during pregnancy.  Take a prenatal vitamin that contains at least 600 micrograms (mcg) of folic acid.  If you develop constipation, try taking a stool softener if your health care provider approves. Eating and drinking   Eat a balanced diet that includes fresh fruits and vegetables, whole grains, good sources of protein such as meat, eggs, or tofu,  and low-fat dairy. Your health care provider will help you determine the amount of weight gain that is right for you.  Avoid raw meat and uncooked cheese. These carry germs that can cause birth defects in the baby.  If you have low calcium intake from food, talk to your health care provider about whether you should take a daily calcium supplement.  Eat four or five small meals rather than three large meals a day.  Limit foods that are high in fat and processed sugars, such as fried and sweet foods.  To prevent constipation: ? Drink enough fluid to keep your urine clear or pale yellow. ? Eat foods that are high in fiber, such as fresh fruits and vegetables, whole grains, and beans. Activity  Exercise only as directed by your health care provider. Most women can continue their usual exercise routine during pregnancy. Try to exercise for 30 minutes at least 5 days a week. Stop exercising if you experience uterine contractions.  Avoid heavy lifting.  Do   not exercise in extreme heat or humidity, or at high altitudes.  Wear low-heel, comfortable shoes.  Practice good posture.  You may continue to have sex unless your health care provider tells you otherwise. Relieving pain and discomfort  Take frequent breaks and rest with your legs elevated if you have leg cramps or low back pain.  Take warm sitz baths to soothe any pain or discomfort caused by hemorrhoids. Use hemorrhoid cream if your health care provider approves.  Wear a good support bra to prevent discomfort from breast tenderness.  If you develop varicose veins: ? Wear support pantyhose or compression stockings as told by your healthcare provider. ? Elevate your feet for 15 minutes, 3-4 times a day. Prenatal care  Write down your questions. Take them to your prenatal visits.  Keep all your prenatal visits as told by your health care provider. This is important. Safety  Wear your seat belt at all times when driving.  Make  a list of emergency phone numbers, including numbers for family, friends, the hospital, and police and fire departments. General instructions  Avoid cat litter boxes and soil used by cats. These carry germs that can cause birth defects in the baby. If you have a cat, ask someone to clean the litter box for you.  Do not travel far distances unless it is absolutely necessary and only with the approval of your health care provider.  Do not use hot tubs, steam rooms, or saunas.  Do not drink alcohol.  Do not use any products that contain nicotine or tobacco, such as cigarettes and e-cigarettes. If you need help quitting, ask your health care provider.  Do not use any medicinal herbs or unprescribed drugs. These chemicals affect the formation and growth of the baby.  Do not douche or use tampons or scented sanitary pads.  Do not cross your legs for long periods of time.  To prepare for the arrival of your baby: ? Take prenatal classes to understand, practice, and ask questions about labor and delivery. ? Make a trial run to the hospital. ? Visit the hospital and tour the maternity area. ? Arrange for maternity or paternity leave through employers. ? Arrange for family and friends to take care of pets while you are in the hospital. ? Purchase a rear-facing car seat and make sure you know how to install it in your car. ? Pack your hospital bag. ? Prepare the baby's nursery. Make sure to remove all pillows and stuffed animals from the baby's crib to prevent suffocation.  Visit your dentist if you have not gone during your pregnancy. Use a soft toothbrush to brush your teeth and be gentle when you floss. Contact a health care provider if:  You are unsure if you are in labor or if your water has broken.  You become dizzy.  You have mild pelvic cramps, pelvic pressure, or nagging pain in your abdominal area.  You have lower back pain.  You have persistent nausea, vomiting, or  diarrhea.  You have an unusual or bad smelling vaginal discharge.  You have pain when you urinate. Get help right away if:  Your water breaks before 37 weeks.  You have regular contractions less than 5 minutes apart before 37 weeks.  You have a fever.  You are leaking fluid from your vagina.  You have spotting or bleeding from your vagina.  You have severe abdominal pain or cramping.  You have rapid weight loss or weight gain.  You have   shortness of breath with chest pain.  You notice sudden or extreme swelling of your face, hands, ankles, feet, or legs.  Your baby makes fewer than 10 movements in 2 hours.  You have severe headaches that do not go away when you take medicine.  You have vision changes. Summary  The third trimester is from week 28 through week 40, months 7 through 9. The third trimester is a time when the unborn baby (fetus) is growing rapidly.  During the third trimester, your discomfort may increase as you and your baby continue to gain weight. You may have abdominal, leg, and back pain, sleeping problems, and an increased need to urinate.  During the third trimester your breasts will keep growing and they will continue to become tender. A yellow fluid (colostrum) may leak from your breasts. This is the first milk you are producing for your baby.  False labor is a condition in which you feel small, irregular tightenings of the muscles in the womb (contractions) that eventually go away. These are called Braxton Hicks contractions. Contractions may last for hours, days, or even weeks before true labor sets in.  Signs of labor can include: abdominal cramps; regular contractions that start at 10 minutes apart and become stronger and more frequent with time; watery or bloody mucus discharge that comes from the vagina; increased pelvic pressure and dull back pain; and leaking of amniotic fluid. This information is not intended to replace advice given to you by your  health care provider. Make sure you discuss any questions you have with your health care provider. Document Revised: 01/21/2019 Document Reviewed: 11/05/2016 Elsevier Patient Education  2020 [employer]  

## 2020-08-31 NOTE — Progress Notes (Signed)
    Routine Prenatal Care Visit  Subjective  Sharon Lambert is a 29 y.o. [phone] at [redacted]w[redacted]d being seen today for ongoing prenatal care.  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  ----------------------------------------------------------------------------------- Patient reports no complaints.   Contractions: Irregular. Vag. Bleeding: None.  Movement: Present. Denies leaking of fluid.  ----------------------------------------------------------------------------------- The following portions of the patient's history were reviewed and updated as appropriate: allergies, current medications, past family history, past medical history, past social history, past surgical history and problem list. Problem list updated.   Objective  Blood pressure 120/72, height 5\' 8"  (1.727 m), weight 192 lb 9.6 oz (87.4 kg), last menstrual period 12/13/2019. Pregravid weight 160 lb (72.6 kg) Total Weight Gain 32 lb 9.6 oz (14.8 kg) Urinalysis:      Fetal Status: Fetal Heart Rate (bpm): 140 Fundal Height: 37 cm Movement: Present  Presentation: Vertex  General:  Alert, oriented and cooperative. Patient is in no acute distress.  Skin: Skin is warm and dry. No rash noted.   Cardiovascular: Normal heart rate noted  Respiratory: Normal respiratory effort, no problems with respiration noted  Abdomen: Soft, gravid, appropriate for gestational age. Pain/Pressure: Absent     Pelvic:  Cervical exam performed Dilation: 2 Effacement (%): 90 Station: -3  Extremities: Normal range of motion.  Edema: None  Mental Status: Normal mood and affect. Normal behavior. Normal judgment and thought content.     Assessment   29 y.o.  38 at [redacted]w[redacted]d by  09/18/2020, by Last Menstrual Period presenting for routine prenatal visit  Plan   Pregnant Problems (from 03/02/20 to present)    Problem Noted Resolved   Supervision of normal pregnancy 03/10/2020 by 03/12/2020, CNM No   Overview Addendum 08/31/2020  4:40 PM by 09/02/2020, MD    [facility]inic [facility] Prenatal Labs  Dating Lmp=14wk Natale Milch Blood type: B/Positive/-- (05/28 1600)   Genetic Screen  NIPS: normal xx Antibody:Negative (05/28 1600)  Anatomic 11-02-1976  fetal cystic kidney per MFM; monthly scans Rubella: 3.43 (05/28 1600)  Varicella: immune  GTT Third trimester: 120 RPR: Non Reactive (05/28 1600)   Rhogam Not needed HBsAg: Negative (05/28 1600)   Vaccines TDAP: 07/13/2020                   Flu Shot: 08/10/2020 HIV: Non Reactive (05/28 1600)   Baby Food plans formula- reviewed breastfeeding education            GBS: Negative  Contraception  Pap: 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    Return in about 1 week (around 09/07/2020) for ROB.  09/09/2020 MD [facility], [facility] 08/31/2020, 4:40 PM

## 2020-08-31 NOTE — Addendum Note (Signed)
Addended by: Clement Husbands A on: 08/31/2020 04:59 PM   Modules accepted: Orders

## 2020-08-31 NOTE — Progress Notes (Signed)
ROB [phone]

## 2020-09-05 ENCOUNTER — Other Ambulatory Visit: Payer: Self-pay

## 2020-09-05 ENCOUNTER — Other Ambulatory Visit: Payer: Self-pay | Admitting: Obstetrics and Gynecology

## 2020-09-05 ENCOUNTER — Ambulatory Visit: Payer: Medicaid Other

## 2020-09-05 ENCOUNTER — Ambulatory Visit (HOSPITAL_BASED_OUTPATIENT_CLINIC_OR_DEPARTMENT_OTHER): Payer: Medicaid Other

## 2020-09-05 DIAGNOSIS — O43199 Other malformation of placenta, unspecified trimester: Secondary | ICD-10-CM

## 2020-09-05 DIAGNOSIS — O358XX0 Maternal care for other (suspected) fetal abnormality and damage, not applicable or unspecified: Secondary | ICD-10-CM

## 2020-09-05 DIAGNOSIS — O35EXX0 Maternal care for other (suspected) fetal abnormality and damage, fetal genitourinary anomalies, not applicable or unspecified: Secondary | ICD-10-CM

## 2020-09-05 DIAGNOSIS — Z3A38 38 weeks gestation of pregnancy: Secondary | ICD-10-CM | POA: Diagnosis not present

## 2020-09-05 DIAGNOSIS — O43193 Other malformation of placenta, third trimester: Secondary | ICD-10-CM | POA: Insufficient documentation

## 2020-09-05 DIAGNOSIS — O3441 Maternal care for other abnormalities of cervix, first trimester: Secondary | ICD-10-CM

## 2020-09-05 NOTE — Progress Notes (Unsigned)
Sharon Lambert 01/06/1991 [redacted]w[redacted]d  Fetus A Non-Stress Test Interpretation for 09/05/20  Indication: {MFM NST INDICATIONS:20869}      Fetal HR A Mode: External Baseline Rate (A): 139 Variability:Moderate Accelerations: 15x15 Decelerations: None Multiple Birth:No Uterine Activity Mode:Toco  Interpretation:Dr Fang-Reactive NST NST started at 1115 @ completed at 1209

## 2020-09-06 ENCOUNTER — Encounter: Payer: Medicaid Other | Admitting: Obstetrics

## 2020-09-06 ENCOUNTER — Encounter: Payer: Self-pay | Admitting: Obstetrics and Gynecology

## 2020-09-06 ENCOUNTER — Inpatient Hospital Stay
Admission: EM | Admit: 2020-09-06 | Discharge: 2020-09-08 | DRG: 806 | Disposition: A | Discharge status: Home / Self Care | Payer: Medicaid Other | Attending: Obstetrics and Gynecology | Admitting: Obstetrics and Gynecology

## 2020-09-06 ENCOUNTER — Telehealth: Payer: Self-pay

## 2020-09-06 ENCOUNTER — Other Ambulatory Visit: Payer: Self-pay

## 2020-09-06 DIAGNOSIS — O0993 Supervision of high risk pregnancy, unspecified, third trimester: Secondary | ICD-10-CM

## 2020-09-06 DIAGNOSIS — O4292 Full-term premature rupture of membranes, unspecified as to length of time between rupture and onset of labor: Secondary | ICD-10-CM | POA: Diagnosis present

## 2020-09-06 DIAGNOSIS — Z9889 Other specified postprocedural states: Secondary | ICD-10-CM

## 2020-09-06 DIAGNOSIS — O9081 Anemia of the puerperium: Secondary | ICD-10-CM | POA: Diagnosis not present

## 2020-09-06 DIAGNOSIS — D62 Acute posthemorrhagic anemia: Secondary | ICD-10-CM | POA: Diagnosis not present

## 2020-09-06 DIAGNOSIS — Z87891 Personal history of nicotine dependence: Secondary | ICD-10-CM

## 2020-09-06 DIAGNOSIS — O358XX0 Maternal care for other (suspected) fetal abnormality and damage, not applicable or unspecified: Secondary | ICD-10-CM | POA: Diagnosis present

## 2020-09-06 DIAGNOSIS — O3441 Maternal care for other abnormalities of cervix, first trimester: Principal | ICD-10-CM

## 2020-09-06 DIAGNOSIS — Z20822 Contact with and (suspected) exposure to covid-19: Secondary | ICD-10-CM | POA: Diagnosis present

## 2020-09-06 DIAGNOSIS — O35EXX0 Maternal care for other (suspected) fetal abnormality and damage, fetal genitourinary anomalies, not applicable or unspecified: Secondary | ICD-10-CM | POA: Diagnosis present

## 2020-09-06 DIAGNOSIS — O26893 Other specified pregnancy related conditions, third trimester: Secondary | ICD-10-CM | POA: Diagnosis present

## 2020-09-06 DIAGNOSIS — O43213 Placenta accreta, third trimester: Secondary | ICD-10-CM | POA: Diagnosis not present

## 2020-09-06 DIAGNOSIS — O43123 Velamentous insertion of umbilical cord, third trimester: Secondary | ICD-10-CM | POA: Diagnosis present

## 2020-09-06 DIAGNOSIS — O3443 Maternal care for other abnormalities of cervix, third trimester: Secondary | ICD-10-CM | POA: Diagnosis not present

## 2020-09-06 DIAGNOSIS — Z3A38 38 weeks gestation of pregnancy: Secondary | ICD-10-CM | POA: Diagnosis not present

## 2020-09-06 DIAGNOSIS — O4290 Premature rupture of membranes, unspecified as to length of time between rupture and onset of labor, unspecified weeks of gestation: Secondary | ICD-10-CM | POA: Diagnosis present

## 2020-09-06 DIAGNOSIS — O134 Gestational [pregnancy-induced] hypertension without significant proteinuria, complicating childbirth: Secondary | ICD-10-CM | POA: Diagnosis present

## 2020-09-06 HISTORY — DX: Other specified health status: Z78.9

## 2020-09-06 LAB — URINALYSIS, COMPLETE (UACMP) WITH MICROSCOPIC
Bilirubin Urine: NEGATIVE
Glucose, UA: NEGATIVE mg/dL
Hgb urine dipstick: NEGATIVE
Ketones, ur: 5 mg/dL — AB
Nitrite: NEGATIVE
Protein, ur: 30 mg/dL — AB
Specific Gravity, Urine: 1.02 (ref 1.005–1.030)
WBC, UA: >50 WBC/hpf — ABNORMAL HIGH (ref 0–5)
pH: 6 (ref 5.0–8.0)

## 2020-09-06 LAB — CBC
HCT: 33.7 % — ABNORMAL LOW (ref 36.0–46.0)
Hemoglobin: 11.7 g/dL — ABNORMAL LOW (ref 12.0–15.0)
MCH: 32.9 pg (ref 26.0–34.0)
MCHC: 34.7 g/dL (ref 30.0–36.0)
MCV: 94.7 fL (ref 80.0–100.0)
Platelets: 165 10*3/uL (ref 150–400)
RBC: 3.56 MIL/uL — ABNORMAL LOW (ref 3.87–5.11)
RDW: 12 % (ref 11.5–15.5)
WBC: 6.9 10*3/uL (ref 4.0–10.5)
nRBC: 0 % (ref 0.0–0.2)

## 2020-09-06 LAB — COMPREHENSIVE METABOLIC PANEL
ALT: 14 U/L (ref 0–44)
AST: 20 U/L (ref 15–41)
Albumin: 2.8 g/dL — ABNORMAL LOW (ref 3.5–5.0)
Alkaline Phosphatase: 162 U/L — ABNORMAL HIGH (ref 38–126)
Anion gap: 10 (ref 5–15)
BUN: 10 mg/dL (ref 6–20)
CO2: 20 mmol/L — ABNORMAL LOW (ref 22–32)
Calcium: 8.7 mg/dL — ABNORMAL LOW (ref 8.9–10.3)
Chloride: 103 mmol/L (ref 98–111)
Creatinine, Ser: 0.61 mg/dL (ref 0.44–1.00)
GFR, Estimated: >60 mL/min (ref >60)
Glucose, Bld: 143 mg/dL — ABNORMAL HIGH (ref 70–99)
Potassium: 3.4 mmol/L — ABNORMAL LOW (ref 3.5–5.1)
Sodium: 133 mmol/L — ABNORMAL LOW (ref 135–145)
Total Bilirubin: 1 mg/dL (ref 0.3–1.2)
Total Protein: 6.3 g/dL — ABNORMAL LOW (ref 6.5–8.1)

## 2020-09-06 LAB — RESP PANEL BY RT-PCR (FLU A&B, COVID) ARPGX2
Influenza A by PCR: NEGATIVE
Influenza B by PCR: NEGATIVE
SARS Coronavirus 2 by RT PCR: NEGATIVE

## 2020-09-06 LAB — RUPTURE OF MEMBRANE (ROM)PLUS: Rom Plus: POSITIVE

## 2020-09-06 LAB — TYPE AND SCREEN
ABO/RH(D): B POS
Antibody Screen: NEGATIVE

## 2020-09-06 LAB — PROTEIN / CREATININE RATIO, URINE
Creatinine, Urine: 173 mg/dL
Protein Creatinine Ratio: 0.17 mg/mg{Cre} — ABNORMAL HIGH (ref 0.00–0.15)
Total Protein, Urine: 30 mg/dL

## 2020-09-06 LAB — ABO/RH: ABO/RH(D): B POS

## 2020-09-06 MED ORDER — OXYTOCIN-SODIUM CHLORIDE 30-0.9 UT/500ML-% IV SOLN
INTRAVENOUS | Status: AC
  Administered 2020-09-06: 1 m[IU]/min via INTRAVENOUS
  Filled 2020-09-06: qty 1000

## 2020-09-06 MED ORDER — ONDANSETRON HCL 4 MG/2ML IJ SOLN: 4.0000 mg | Freq: Four times a day (QID) | INTRAMUSCULAR | Status: DC | PRN

## 2020-09-06 MED ORDER — LACTATED RINGERS IV SOLN
500.0000 mL | INTRAVENOUS | Status: DC | PRN
  Administered 2020-09-07: 500 mL via INTRAVENOUS

## 2020-09-06 MED ORDER — OXYTOCIN-SODIUM CHLORIDE 30-0.9 UT/500ML-% IV SOLN
2.5000 [IU]/h | INTRAVENOUS | Status: DC
  Administered 2020-09-07: 2.5 [IU]/h via INTRAVENOUS

## 2020-09-06 MED ORDER — OXYTOCIN 10 UNIT/ML IJ SOLN: 10.0000 [IU] | Freq: Once | INTRAMUSCULAR | Status: DC

## 2020-09-06 MED ORDER — MISOPROSTOL 200 MCG PO TABS
ORAL_TABLET | ORAL | Status: AC
  Filled 2020-09-06: qty 4

## 2020-09-06 MED ORDER — AMMONIA AROMATIC IN INHA
RESPIRATORY_TRACT | Status: AC
  Filled 2020-09-06: qty 10

## 2020-09-06 MED ORDER — OXYTOCIN-SODIUM CHLORIDE 30-0.9 UT/500ML-% IV SOLN: 1.0000 m[IU]/min | INTRAVENOUS | Status: DC

## 2020-09-06 MED ORDER — LIDOCAINE HCL (PF) 1 % IJ SOLN
30.0000 mL | INTRAMUSCULAR | Status: DC | PRN
  Administered 2020-09-07: 30 mL via SUBCUTANEOUS

## 2020-09-06 MED ORDER — OXYTOCIN BOLUS FROM INFUSION
333.0000 mL | Freq: Once | INTRAVENOUS | Status: AC
  Administered 2020-09-07: 333 mL via INTRAVENOUS

## 2020-09-06 MED ORDER — SOD CITRATE-CITRIC ACID 500-334 MG/5ML PO SOLN: 30.0000 mL | ORAL | Status: DC | PRN

## 2020-09-06 MED ORDER — LIDOCAINE HCL (PF) 1 % IJ SOLN
INTRAMUSCULAR | Status: AC
  Filled 2020-09-06: qty 30

## 2020-09-06 MED ORDER — LACTATED RINGERS IV SOLN: INTRAVENOUS | Status: DC

## 2020-09-06 MED ORDER — TERBUTALINE SULFATE 1 MG/ML IJ SOLN: 0.2500 mg | Freq: Once | INTRAMUSCULAR | Status: DC | PRN

## 2020-09-06 MED ORDER — OXYTOCIN 10 UNIT/ML IJ SOLN
INTRAMUSCULAR | Status: AC
  Filled 2020-09-06: qty 2

## 2020-09-06 NOTE — H&P (Signed)
OB History & Physical   History of Present Illness:  Chief Complaint: my water broke  HPI:  Sharon Lambert is a 29 y.o. [phone] female at [redacted]w[redacted]d dated by her LMP consistent with a 14 week ultrasound.  Her pregnancy has been complicated by history of LEEP procedure in 2020, fetal left multicystic dysplastic kidney, marginal cord insertion.    She has been evaluated by MFM for her fetal left multicystic dysplastic kidney. The kidney has grown quite large and takes up a large space in her abdomen.  MFM has stated that there may be breathing issues at the time of birth. There is a very small chance of abdominal dystocia at birth, though the last abdominal measurement showed that the abdomen measured 37%ile.  The EFW yesterday was 3,065 grams (6 lb 12 oz) which is the 31st  %ile.   She denies contractions.   She reports leakage of fluid.   She denies vaginal bleeding.   She reports fetal movement.   She notes a gush of fluid at 0900 today of clear fluid.  Denies HA, visual changes, and RUQ pain.   Total weight gain for pregnancy: 15.4 kg   Obstetrical Problem List: Pregnant Problems (from 03/02/20 to present)    Problem Noted Resolved   Supervision of normal pregnancy 03/10/2020 by Tresea Mall, CNM No   Overview Addendum 08/31/2020  4:40 PM by Natale Milch, MD    [facility]inic [facility] Prenatal Labs  Dating Lmp=14wk [place] Blood type: B/Positive/-- (05/28 1600)   Genetic Screen  NIPS: normal xx Antibody:Negative (05/28 1600)  Anatomic US  fetal cystic kidney per MFM; monthly scans Rubella: 3.43 (05/28 1600)  Varicella: immune  GTT Third trimester: 120 RPR: Non Reactive (05/28 1600)   Rhogam Not needed HBsAg: Negative (05/28 1600)   Vaccines TDAP: 07/13/2020                   Flu Shot: 08/10/2020 HIV: Non Reactive (05/28 1600)   Baby Food plans formula- reviewed breastfeeding education            GBS: Negative  Contraception  Pap: 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 Medical history non-contributory     Past Surgical History:  Procedure Laterality Date  . cerivcal Leep     01/2019    No Known Allergies  Prior to Admission medications   Medication Sig Start Date End Date Taking? Authorizing Provider  Prenatal Vit-Fe Fumarate-FA (PRENATAL MULTIVITAMIN) TABS tablet Take 1 tablet by mouth daily at 12 noon.   Yes [provider]    OB History  Gravida Para Term Preterm AB Living  3 1   1 1 1   SAB TAB Ectopic Multiple Live Births    1     1    # Outcome Date GA Lbr Len/2nd Weight Sex Delivery Anes PTL Lv  3 Current           2 TAB 2019 [redacted]w[redacted]d         1 Preterm 04/28/09 [redacted]w[redacted]d  2977 g M Vag-Spont   LIV    Prenatal  care site: [facility]  Social History: She  reports that she has quit smoking. Her smoking use included cigarettes. She smoked 0.50 packs per day. She has never used smokeless tobacco. She reports previous alcohol use. She reports that she does not use drugs.  Family History: family history includes Diabetes in her father; Prostate cancer in her paternal grandfather; Skin cancer in her father.   Review of Systems:  Review of Systems  Constitutional: Negative.   HENT: Negative.   Eyes: Negative.   Respiratory: Negative.   Cardiovascular: Negative.   Gastrointestinal: Negative.   Genitourinary: Negative.   Musculoskeletal: Negative.   Skin: Negative.   Neurological: Negative.   Psychiatric/Behavioral: Negative.      Physical Exam:  BP (!) 141/86   Pulse 82   Temp 98.1 F (36.7 C) (Oral)   Resp 16   Ht 5\' 8"  (1.727 m)   Wt 88 kg   LMP 12/13/2019 (Exact Date)   BMI 29.50 kg/m   Physical Exam Constitutional:       General: She is not in acute distress.    Appearance: Normal appearance. She is well-developed.  Genitourinary:     Genitourinary Comments: 3cm per RN  HENT:     Head: Normocephalic and atraumatic.  Eyes:     General: No scleral icterus.    Conjunctiva/sclera: Conjunctivae normal.  Cardiovascular:     Rate and Rhythm: Normal rate and regular rhythm.     Heart sounds: No murmur heard.  No friction rub. No gallop.   Pulmonary:     Effort: Pulmonary effort is normal. No respiratory distress.     Breath sounds: Normal breath sounds. No wheezing or rales.  Abdominal:     General: Bowel sounds are normal. There is no distension.     Palpations: Abdomen is soft. There is no mass (gravid, NT).     Tenderness: There is no abdominal tenderness. There is no guarding or rebound.  Musculoskeletal:        General: Normal range of motion.     Cervical back: Normal range of motion and neck supple.  Neurological:     General: No focal deficit present.     Mental Status: She is alert and oriented to person, place, and time.     Cranial Nerves: No cranial nerve deficit.  Skin:    General: Skin is warm and dry.     Findings: No erythema.  Psychiatric:        Mood and Affect: Mood normal.        Behavior: Behavior normal.        Judgment: Judgment normal.      Baseline FHR: 130 beats/min   Variability: moderate   Accelerations: present   Decelerations: absent Contractions: present frequency: infrequent Overall assessment: cat 1  Bedside Ultrasound:  Number of Fetus: 1  Presentation: cephalic  Fluid: subjectively normal  Placental Location: posterior  Pre-eclampsia labs: Lab Results  Component Value Date   WBC 6.9 09/06/2020   HGB 11.7 (L) 09/06/2020   HCT 33.7 (L) 09/06/2020   PLT 165 09/06/2020   CREATININE 0.61 09/06/2020   ALT 14 09/06/2020   AST 20 09/06/2020   PROTCRRATIO 0.17 (H) 09/06/2020     Lab Results  Component Value Date   SARSCOV2NAA NEGATIVE 09/06/2020     Assessment:  Sharon Lambert is a 29 y.o. 38 female at [redacted]w[redacted]d with PROM, fetus with left multicystic dysplastic kidney.    Plan:  1. Admit to Labor &  Delivery  2. CBC, T&S, Clrs, IVF 3. GBS negative.   4. Fetwal well-being: reassuring 5. Fetal left multicystic dysplastic kidney: neonatology and nursing aware of risk of difficult breathing for fetus once born. Also, will keep in mind risk of abdominal dystocia, though MFM said they did not believe this should require a c-section. 6. PROM: Will recheck and augment, as indicated.    Thomasene Mohair, MD 09/06/2020 3:13 PM

## 2020-09-06 NOTE — Telephone Encounter (Signed)
Pt called office; tx to me; water has broke.  Adv pt to go to L&D via ED.  Mindy notified.

## 2020-09-06 NOTE — Progress Notes (Signed)
Labor Check  Subj:  Complaints: feeling ctx, but tolerating   Obj:  BP 127/76 (BP Location: Right Arm)   Pulse 77   Temp 97.8 F (36.6 C) (Oral)   Resp 16   Ht 5\' 8"  (1.727 m)   Wt 88 kg   LMP 12/13/2019 (Exact Date)   BMI 29.50 kg/m  Dose (milli-units/min) Oxytocin: 15 milli-units/min  Cervix: Dilation: 3 / Effacement (%): 100 / Station: -1   Some mild scar tissue at edge of remaining cervix Baseline FHR: 135 beats/min   Variability: moderate   Accelerations: present   Decelerations: absent Contractions: present frequency: 3-4 q 10 (coupling with sometimes up to 6 minutes after second ctx) Overall assessment: cat 1  Female chaperone present for pelvic exam:   A/P: 29 y.o. 38 female at [redacted]w[redacted]d with PROM.  1.  Labor: continue pitocin per protocol  2.  FWB: reassuring, Overall assessment: category 1  3.  GBS negative  4.  Pain: prn 5.  Recheck: 2 hours/prn   [redacted]w[redacted]d, MD, [facility], [facility] 09/06/2020 10:45 PM

## 2020-09-06 NOTE — OB Triage Note (Signed)
Pt is a G3P1 at [redacted]w[redacted]d that presents from home with c./o LOF. Pt states she had a big gush of clear fluid around 0900 this am. Pt states she has had ongoing contractions but nothing regular. Pt denies VB, and states positive FM. Initial BP 138/89 BP and cycling q 15 minutes. EFM applied and initial FHT 140.

## 2020-09-07 ENCOUNTER — Inpatient Hospital Stay: Payer: Medicaid Other | Admitting: Anesthesiology

## 2020-09-07 ENCOUNTER — Encounter: Payer: Self-pay | Admitting: Obstetrics and Gynecology

## 2020-09-07 DIAGNOSIS — O0993 Supervision of high risk pregnancy, unspecified, third trimester: Secondary | ICD-10-CM

## 2020-09-07 DIAGNOSIS — O3443 Maternal care for other abnormalities of cervix, third trimester: Secondary | ICD-10-CM

## 2020-09-07 DIAGNOSIS — O4292 Full-term premature rupture of membranes, unspecified as to length of time between rupture and onset of labor: Principal | ICD-10-CM

## 2020-09-07 DIAGNOSIS — O358XX0 Maternal care for other (suspected) fetal abnormality and damage, not applicable or unspecified: Secondary | ICD-10-CM

## 2020-09-07 DIAGNOSIS — Z3A38 38 weeks gestation of pregnancy: Secondary | ICD-10-CM

## 2020-09-07 DIAGNOSIS — O43213 Placenta accreta, third trimester: Secondary | ICD-10-CM

## 2020-09-07 LAB — CBC
HCT: 32.5 % — ABNORMAL LOW (ref 36.0–46.0)
Hemoglobin: 11.2 g/dL — ABNORMAL LOW (ref 12.0–15.0)
MCH: 33.4 pg (ref 26.0–34.0)
MCHC: 34.5 g/dL (ref 30.0–36.0)
MCV: 97 fL (ref 80.0–100.0)
Platelets: 151 10*3/uL (ref 150–400)
RBC: 3.35 MIL/uL — ABNORMAL LOW (ref 3.87–5.11)
RDW: 12.1 % (ref 11.5–15.5)
WBC: 9.7 10*3/uL (ref 4.0–10.5)
nRBC: 0 % (ref 0.0–0.2)

## 2020-09-07 LAB — RPR: RPR Ser Ql: NONREACTIVE

## 2020-09-07 MED ORDER — BENZOCAINE-MENTHOL 20-0.5 % EX AERO: 1.0000 "application " | INHALATION_SPRAY | CUTANEOUS | Status: DC | PRN

## 2020-09-07 MED ORDER — ACETAMINOPHEN 325 MG PO TABS: 650.0000 mg | ORAL_TABLET | ORAL | Status: DC | PRN

## 2020-09-07 MED ORDER — FERROUS SULFATE 325 (65 FE) MG PO TABS
325.0000 mg | ORAL_TABLET | Freq: Two times a day (BID) | ORAL | Status: DC
  Administered 2020-09-07 – 2020-09-08 (×3): 325 mg via ORAL
  Filled 2020-09-07 (×3): qty 1

## 2020-09-07 MED ORDER — PRENATAL MULTIVITAMIN CH
1.0000 | ORAL_TABLET | Freq: Every day | ORAL | Status: DC
  Administered 2020-09-07 – 2020-09-08 (×2): 1 via ORAL
  Filled 2020-09-07 (×2): qty 1

## 2020-09-07 MED ORDER — FENTANYL 2.5 MCG/ML W/ROPIVACAINE 0.15% IN NS 100 ML EPIDURAL (ARMC)
12.0000 mL/h | EPIDURAL | Status: DC
  Administered 2020-09-07: 12 mL/h via EPIDURAL

## 2020-09-07 MED ORDER — DIBUCAINE (PERIANAL) 1 % EX OINT: 1.0000 "application " | TOPICAL_OINTMENT | CUTANEOUS | Status: DC | PRN

## 2020-09-07 MED ORDER — SENNOSIDES-DOCUSATE SODIUM 8.6-50 MG PO TABS
2.0000 | ORAL_TABLET | ORAL | Status: DC
  Administered 2020-09-07: 2 via ORAL
  Filled 2020-09-07: qty 2

## 2020-09-07 MED ORDER — LIDOCAINE HCL (PF) 1 % IJ SOLN
INTRAMUSCULAR | Status: DC | PRN
  Administered 2020-09-07: 2 mL

## 2020-09-07 MED ORDER — LACTATED RINGERS IV SOLN: 500.0000 mL | Freq: Once | INTRAVENOUS | Status: DC

## 2020-09-07 MED ORDER — HYDROCODONE-ACETAMINOPHEN 5-325 MG PO TABS: 1.0000 | ORAL_TABLET | Freq: Three times a day (TID) | ORAL | Status: DC | PRN

## 2020-09-07 MED ORDER — PHENYLEPHRINE 40 MCG/ML (10ML) SYRINGE FOR IV PUSH (FOR BLOOD PRESSURE SUPPORT): 80.0000 ug | PREFILLED_SYRINGE | INTRAVENOUS | Status: DC | PRN

## 2020-09-07 MED ORDER — DIPHENHYDRAMINE HCL 50 MG/ML IJ SOLN: 12.5000 mg | INTRAMUSCULAR | Status: DC | PRN

## 2020-09-07 MED ORDER — EPHEDRINE 5 MG/ML INJ: 10.0000 mg | INTRAVENOUS | Status: DC | PRN

## 2020-09-07 MED ORDER — ONDANSETRON HCL 4 MG PO TABS: 4.0000 mg | ORAL_TABLET | ORAL | Status: DC | PRN

## 2020-09-07 MED ORDER — SODIUM CHLORIDE 0.9 % IV SOLN
INTRAVENOUS | Status: DC | PRN
  Administered 2020-09-07 (×3): 5 mL via EPIDURAL

## 2020-09-07 MED ORDER — WITCH HAZEL-GLYCERIN EX PADS: 1.0000 "application " | MEDICATED_PAD | CUTANEOUS | Status: DC | PRN

## 2020-09-07 MED ORDER — ONDANSETRON HCL 4 MG/2ML IJ SOLN: 4.0000 mg | INTRAMUSCULAR | Status: DC | PRN

## 2020-09-07 MED ORDER — FENTANYL 2.5 MCG/ML W/ROPIVACAINE 0.15% IN NS 100 ML EPIDURAL (ARMC)
EPIDURAL | Status: AC
  Filled 2020-09-07: qty 100

## 2020-09-07 MED ORDER — DIPHENHYDRAMINE HCL 25 MG PO CAPS: 25.0000 mg | ORAL_CAPSULE | Freq: Four times a day (QID) | ORAL | Status: DC | PRN

## 2020-09-07 MED ORDER — COCONUT OIL OIL: 1.0000 "application " | TOPICAL_OIL | Status: DC | PRN

## 2020-09-07 MED ORDER — IBUPROFEN 600 MG PO TABS
600.0000 mg | ORAL_TABLET | Freq: Four times a day (QID) | ORAL | Status: DC
  Administered 2020-09-07 – 2020-09-08 (×5): 600 mg via ORAL
  Filled 2020-09-07 (×5): qty 1

## 2020-09-07 MED ORDER — SIMETHICONE 80 MG PO CHEW: 80.0000 mg | CHEWABLE_TABLET | ORAL | Status: DC | PRN

## 2020-09-07 NOTE — Discharge Summary (Signed)
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 yo [id number] 340w3das admitted in Latent Labor on 09/06/2020. Patient had an uncomplicated labor course as follows:  Membrane Rupture Time/Date: 9:00 AM ,09/06/2020   Delivery Method:Vaginal, Spontaneous  Episiotomy: None  Lacerations:  2nd degree;Vaginal;Perineal  Patient had an uncomplicated postpartum course.  She is ambulating, tolerating a regular diet, passing flatus, and urinating well. Patient is discharged home in stable condition on 09/08/20.  Newborn Data: Birth date:09/07/2020  Birth time:2:30 AM  Gender:Female  Living status:Living  Apgars:8 ,9  Weight:3170 g   Magnesium Sulfate received: No BMZ received: No Rhophylac:N/A MMR:No T-DaP:Given prenatally on 07/04/2020 Flu: Yes on 08/03/2020 Transfusion:No  Physical exam  Vitals:   09/07/20 1627 09/07/20 1700 09/07/20 2301 09/08/20 0741  BP: (!) 128/94 128/89 131/86 (!) 126/95   Pulse: 63  82 86  Resp: 18  18 18   Temp: 98.9 F (37.2 C)  98.2 F (36.8 C) 98.3 F (36.8 C)  TempSrc: Oral  Oral Oral  SpO2: 99%  98% 98%  Weight:      Height:       General: alert and cooperative Lochia: appropriate Uterine Fundus: firm Incision: N/A DVT Evaluation: No evidence of DVT seen on physical exam. Labs: Lab Results  Component Value Date   WBC 9.7 09/07/2020   HGB 11.2 (L) 09/07/2020   HCT 32.5 (L) 09/07/2020   MCV 97.0 09/07/2020   PLT 151 09/07/2020   CMP Latest Ref Rng & Units 09/06/2020  Glucose 70 - 99 mg/dL 143(H)  BUN 6 - 20 mg/dL 10  Creatinine 0.44 - 1.00 mg/dL 0.61  Sodium 135 - 145 mmol/L 133(L)  Potassium 3.5 - 5.1 mmol/L 3.4(L)  Chloride 98 - 111 mmol/L 103  CO2 22 - 32 mmol/L 20(L)  Calcium 8.9 - 10.3 mg/dL 8.7(L)  Total Protein 6.5 - 8.1 g/dL 6.3(L)  Total Bilirubin 0.3 - 1.2 mg/dL 1.0  Alkaline Phos 38 - 126 U/L 162(H)  AST 15 - 41 U/L 20  ALT 0 - 44 U/L 14   Edinburgh Score: Edinburgh Postnatal Depression Scale Screening Tool 09/07/2020  I have been able to laugh and see the funny side of things. 0  I have looked forward with enjoyment to things. 0  I have blamed myself unnecessarily when things went wrong. 0  I have been anxious or  worried for no good reason. 0  I have felt scared or panicky for no good reason. 0  Things have been getting on top of me. 0  I have been so unhappy that I have had difficulty sleeping. 0  I have felt sad or miserable. 0  I have been so unhappy that I have been crying. 0  The thought of harming myself has occurred to me. 0  Edinburgh Postnatal Depression Scale Total 0      After visit meds:  Allergies as of 09/08/2020   No Known Allergies     Medication List    TAKE these medications   medroxyPROGESTERone 150 MG/ML injection Commonly known as: DEPO-PROVERA Inject 1 mL (150 mg total) into the muscle every 3 (three) months.   prenatal multivitamin Tabs tablet Take 1 tablet by mouth daily at  12 noon.            Discharge Care Instructions  (From admission, onward)         Start     Ordered   09/08/20 0000  Discharge wound care:       Comments: SHOWER DAILY Wash incision gently with soap and water.  Call office with any drainage, redness, or firmness of the incision.   09/08/20 1140           Discharge home in stable condition Infant Feeding: Bottle Infant Disposition:home with mother Discharge instruction: per After Visit Summary and Postpartum booklet. Activity: Advance as tolerated. Pelvic rest for 6 weeks.  Diet: routine diet Anticipated Birth Control: PP Depo given Postpartum Appointment:6 weeks Future Appointments:No future appointments. Follow up Visit:  Follow-up Information    Will Bonnet, MD. Schedule an appointment as soon as possible for a visit in 6 week(s).   Specialty: Obstetrics and Gynecology Why: Routine postpartum care Contact information: [address] [place] [address] [phone]              SIGNED:  Adrian Prows MD, [facility], [facility] 09/08/2020 11:41 AM

## 2020-09-07 NOTE — Anesthesia Procedure Notes (Signed)
Epidural Patient location during procedure: OB Start time: 09/07/2020 1:55 AM End time: 09/07/2020 2:09 AM  Staffing Anesthesiologist: Karleen Hampshire, MD Performed: anesthesiologist   Preanesthetic Checklist Completed: patient identified, IV checked, site marked, risks and benefits discussed, surgical consent, monitors and equipment checked, pre-op evaluation and timeout performed  Epidural Patient position: sitting Prep: ChloraPrep Patient monitoring: heart rate, continuous pulse ox and blood pressure Approach: midline Location: L4-L5 Injection technique: LOR saline  Needle:  Needle type: Tuohy  Needle gauge: 18 G Needle length: 9 cm and 9 Needle insertion depth: 5 cm Catheter type: closed end flexible Catheter size: 20 Guage Catheter at skin depth: 9 cm Test dose: negative and Other  Assessment Events: blood not aspirated, injection not painful, no injection resistance, no paresthesia and negative IV test  Additional Notes Risks and benefits of procedure discussed with patient.  Risks including but not limited to infection, spinal/epidural hematoma, nerve injury, post dural puncture headache, and inadequate/failed block.  Patient expressed understanding and consented to epidural placement. Negative dural puncture.  Negative aspiration.  Negative paresthesia on injection.  Dose given in divided aliquots.  Patient tolerated the procedure well with no immediate complications.  Reason for block:procedure for pain

## 2020-09-07 NOTE — Anesthesia Postprocedure Evaluation (Signed)
Anesthesia Post Note  Patient: Sharon Lambert  Procedure(s) Performed: AN AD HOC LABOR EPIDURAL  Patient location during evaluation: Mother Baby Anesthesia Type: Epidural Level of consciousness: awake and alert Pain management: pain level controlled Vital Signs Assessment: post-procedure vital signs reviewed and stable Respiratory status: spontaneous breathing, nonlabored ventilation and respiratory function stable Cardiovascular status: stable Postop Assessment: no headache, no backache and epidural receding Anesthetic complications: no   No complications documented.   Last Vitals:  Vitals:   09/07/20 0521 09/07/20 0620  BP: 128/87 136/88  Pulse: 77 75  Resp: 20 18  Temp: (!) 36.4 C 36.8 C  SpO2: 98% 98%    Last Pain:  Vitals:   09/07/20 0620  TempSrc: Oral  PainSc:                  Rica Mast

## 2020-09-07 NOTE — Anesthesia Preprocedure Evaluation (Signed)
Anesthesia Evaluation  Patient identified by MRN, date of birth, ID band Patient awake    Reviewed: Allergy & Precautions, H&P , NPO status , Patient's Chart, lab work & pertinent test results  Airway Mallampati: II  TM Distance: >3 FB Neck ROM: full    Dental  (+) Teeth Intact   Pulmonary neg pulmonary ROS, former smoker,           Cardiovascular Exercise Tolerance: Good (-) hypertensionnegative cardio ROS       Neuro/Psych    GI/Hepatic negative GI ROS,   Endo/Other    Renal/GU   negative genitourinary   Musculoskeletal   Abdominal   Peds  Hematology negative hematology ROS (+)   Anesthesia Other Findings Past Medical History: No date: Medical history non-contributory  Past Surgical History: No date: cerivcal Leep     Comment:  01/2019  BMI    Body Mass Index: 29.50 kg/m      Reproductive/Obstetrics (+) Pregnancy                             Anesthesia Physical Anesthesia Plan  ASA: II  Anesthesia Plan: Epidural   Post-op Pain Management:    Induction:   PONV Risk Score and Plan:   Airway Management Planned:   Additional Equipment:   Intra-op Plan:   Post-operative Plan:   Informed Consent: I have reviewed the patients History and Physical, chart, labs and discussed the procedure including the risks, benefits and alternatives for the proposed anesthesia with the patient or authorized representative who has indicated his/her understanding and acceptance.       Plan Discussed with: Anesthesiologist  Anesthesia Plan Comments:         Anesthesia Quick Evaluation

## 2020-09-08 MED ORDER — MEDROXYPROGESTERONE ACETATE 150 MG/ML IM SUSP
150.0000 mg | INTRAMUSCULAR | Status: DC
  Administered 2020-09-08: 150 mg via INTRAMUSCULAR
  Filled 2020-09-08: qty 1

## 2020-09-08 MED ORDER — MEDROXYPROGESTERONE ACETATE 150 MG/ML IM SUSP
150.0000 mg | INTRAMUSCULAR | 3 refills | Status: DC
Start: 2020-09-08 — End: 2021-07-26

## 2020-09-08 NOTE — Progress Notes (Signed)
Patient ID: Sharon Lambert, female   DOB: 1991-02-06, 29 y.o.   MRN: [id number]   Subjective:  She is feeling well. No complaints. Pain control is controlled. Voiding without difficulty. Tolerating a regular diet. Ambulating well.  Objective:   Blood pressure (!) 126/95, pulse 86, temperature 98.3 F (36.8 C), temperature source Oral, resp. rate 18, height 5\' 8"  (1.727 m), weight 88 kg, last menstrual period 12/13/2019, SpO2 98 %, unknown if currently breastfeeding.  General: NAD Pulmonary: no increased work of breathing Abdomen: non-distended, non-tender Uterus:  fundus firm at U; lochia normal Extremities: no edema, no erythema, no tenderness, no signs of DVT  Results for orders placed or performed during the hospital encounter of 09/06/20 (from the past 72 hour(s))  ROM Plus ([facility] only)     Status: None   Collection Time: 09/06/20 10:27 AM  Result Value Ref Range   Rom Plus POSITIVE     Comment: Performed at [facility], [address] [place], [place]  Protein / creatinine ratio, urine     Status: Abnormal   Collection Time: 09/06/20 10:31 AM  Result Value Ref Range   Creatinine, Urine 173 mg/dL   Total Protein, Urine 30 mg/dL    Comment: NO NORMAL RANGE ESTABLISHED FOR THIS TEST   Protein Creatinine Ratio 0.17 (H) 0.00 - 0.15 mg/mg[Cre]    Comment: Performed at [facility], [address] [place], [place]  Urinalysis, Complete w Microscopic Urine, Clean Catch     Status: Abnormal   Collection Time: 09/06/20 10:33 AM  Result Value Ref Range   Color, Urine YELLOW (A) YELLOW   APPearance CLOUDY (A) CLEAR   Specific Gravity, Urine 1.020 1.005 - 1.030   pH 6.0 5.0 - 8.0   Glucose, UA NEGATIVE NEGATIVE mg/dL   Hgb urine dipstick NEGATIVE NEGATIVE   Bilirubin Urine NEGATIVE NEGATIVE   Ketones, ur 5 (A) NEGATIVE mg/dL   Protein, ur 30 (A) NEGATIVE mg/dL   Nitrite NEGATIVE NEGATIVE   Leukocytes,Ua LARGE (A) NEGATIVE   RBC / HPF  6-10 0 - 5 RBC/hpf   WBC, UA >50 (H) 0 - 5 WBC/hpf   Bacteria, UA RARE (A) NONE SEEN   Squamous Epithelial / LPF 11-20 0 - 5   Mucus PRESENT     Comment: Performed at [facility], [address] [place], [place]  CBC     Status: Abnormal   Collection Time: 09/06/20 10:45 AM  Result Value Ref Range   WBC 6.9 4.0 - 10.5 K/uL   RBC 3.56 (L) 3.87 - 5.11 MIL/uL   Hemoglobin 11.7 (L) 12.0 - 15.0 g/dL   HCT 09/08/20 (L) 36 - 46 %   MCV 94.7 80.0 - 100.0 fL   MCH 32.9 26.0 - 34.0 pg   MCHC 34.7 30.0 - 36.0 g/dL   RDW 63.7 85.8 - 85.0 %   Platelets 165 150 - 400 K/uL   nRBC 0.0 0.0 - 0.2 %    Comment: Performed at [facility], [address] [place], [place]  Comprehensive metabolic panel     Status: Abnormal   Collection Time: 09/06/20 10:45 AM  Result Value Ref Range   Sodium 133 (L) 135 - 145 mmol/L   Potassium 3.4 (L) 3.5 - 5.1 mmol/L   Chloride 103 98 - 111 mmol/L   CO2 20 (L) 22 - 32 mmol/L   Glucose, Bld 143 (H) 70 - 99 mg/dL    Comment:  Glucose reference range applies only to samples taken after fasting for at least 8 hours.   BUN 10 6 - 20 mg/dL   Creatinine, Ser 0.73 0.44 - 1.00 mg/dL   Calcium 8.7 (L) 8.9 - 10.3 mg/dL   Total Protein 6.3 (L) 6.5 - 8.1 g/dL   Albumin 2.8 (L) 3.5 - 5.0 g/dL   AST 20 15 - 41 U/L   ALT 14 0 - 44 U/L   Alkaline Phosphatase 162 (H) 38 - 126 U/L   Total Bilirubin 1.0 0.3 - 1.2 mg/dL   GFR, Estimated >71 >06 mL/min    Comment: (NOTE) Calculated using the CKD-EPI Creatinine Equation (2021)    Anion gap 10 5 - 15    Comment: Performed at [facility], [address] [place], [place] [address]  Type and screen [facility]     Status: None   Collection Time: 09/06/20 10:45 AM  Result Value Ref Range   ABO/RH(D) B POS    Antibody Screen NEG    Sample Expiration      09/09/2020,2359 Performed at [facility], [address], [place], [place] [address]   Resp  Panel by RT-PCR (Flu A&B, Covid) Nasopharyngeal Swab     Status: None   Collection Time: 09/06/20 11:40 AM   Specimen: Nasopharyngeal Swab; Nasopharyngeal(NP) swabs in vial transport medium  Result Value Ref Range   SARS Coronavirus 2 by RT PCR NEGATIVE NEGATIVE    Comment: (NOTE) SARS-CoV-2 target nucleic acids are NOT DETECTED.  The SARS-CoV-2 RNA is generally detectable in upper respiratory specimens during the acute phase of infection. The lowest concentration of SARS-CoV-2 viral copies this assay can detect is 138 copies/mL. A negative result does not preclude SARS-Cov-2 infection and should not be used as the sole basis for treatment or other patient management decisions. A negative result may occur with  improper specimen collection/handling, submission of specimen other than nasopharyngeal swab, presence of viral mutation(s) within the areas targeted by this assay, and inadequate number of viral copies(<138 copies/mL). A negative result must be combined with clinical observations, patient history, and epidemiological information. The expected result is Negative.  Fact Sheet for Patients:  BloggerCourse.com  Fact Sheet for Healthcare Providers:  SeriousBroker.it  This test is no t yet approved or cleared by the [place] FDA and  has been authorized for detection and/or diagnosis of SARS-CoV-2 by FDA under an Emergency Use Authorization (EUA). This EUA will remain  in effect (meaning this test can be used) for the duration of the COVID-19 declaration under Section 564(b)(1) of the Act, 21 U.S.C.section 360bbb-3(b)(1), unless the authorization is terminated  or revoked sooner.       Influenza A by PCR NEGATIVE NEGATIVE   Influenza B by PCR NEGATIVE NEGATIVE    Comment: (NOTE) The Xpert Xpress SARS-CoV-2/FLU/RSV plus assay is intended as an aid in the diagnosis of influenza from Nasopharyngeal swab specimens  and should not be used as a sole basis for treatment. Nasal washings and aspirates are unacceptable for Xpert Xpress SARS-CoV-2/FLU/RSV testing.  Fact Sheet for Patients: BloggerCourse.com  Fact Sheet for Healthcare Providers: SeriousBroker.it  This test is not yet approved or cleared by the [place] FDA and has been authorized for detection and/or diagnosis of SARS-CoV-2 by FDA under an Emergency Use Authorization (EUA). This EUA will remain in effect (meaning this test can be used) for the duration of the COVID-19 declaration under Section 564(b)(1) of the Act, 21 U.S.C. section 360bbb-3(b)(1),  unless the authorization is terminated or revoked.  Performed at [facility], [address] [place], [place] [address]   ABO/Rh     Status: None   Collection Time: 09/06/20 11:40 AM  Result Value Ref Range   ABO/RH(D)      B POS Performed at [facility], [address] [place], [place] [address]   RPR     Status: None   Collection Time: 09/06/20 11:40 AM  Result Value Ref Range   RPR Ser Ql NON REACTIVE NON REACTIVE    Comment: Performed at [facility], [address] [place], [place] [address]  CBC     Status: Abnormal   Collection Time: 09/07/20  6:26 AM  Result Value Ref Range   WBC 9.7 4.0 - 10.5 K/uL   RBC 3.35 (L) 3.87 - 5.11 MIL/uL   Hemoglobin 11.2 (L) 12.0 - 15.0 g/dL   HCT 44.8 (L) 36 - 46 %   MCV 97.0 80.0 - 100.0 fL   MCH 33.4 26.0 - 34.0 pg   MCHC 34.5 30.0 - 36.0 g/dL   RDW 18.5 63.1 - 49.7 %   Platelets 151 150 - 400 K/uL   nRBC 0.0 0.0 - 0.2 %    Comment: Performed at [facility], [address] [place], [place] [address]    Assessment:   29 y.o. [phone] postpartum day # 1  Plan:   1) Acute blood loss anemia - hemodynamically stable and asymptomatic - po ferrous sulfate  2) Blood Type --/--/B POS Performed at [facility], [address] [place], [place] [address]  [phone]/24 1140)   3) Rubella 3.43 (05/28 1600) / Varicella Immuned  4) TDAP status - up to date Immunization History  Administered Date(s) Administered  . HPV 9-valent 11/18/2018  . Influenza,inj,Quad PF,6+ Mos 08/10/2020  . PPD Test 04/03/2020  . Tdap 12/29/2019, 07/13/2020    5) Feeding- formula   6) Contraception - desires depo prior to discharge  7) Disposition - desires discharge home today.   Adelene Idler MD [facility], [facility] 09/08/2020 11:35 AM

## 2020-09-08 NOTE — Progress Notes (Signed)
Pt discharged with infant.  Discharge instructions, prescriptions and follow up appointment given to and reviewed with pt. Pt verbalized understanding. Escorted out by auxillary. 

## 2020-09-29 ENCOUNTER — Other Ambulatory Visit: Payer: Medicaid Other

## 2020-10-18 ENCOUNTER — Ambulatory Visit: Payer: Medicaid Other | Admitting: Obstetrics and Gynecology

## 2021-07-17 ENCOUNTER — Ambulatory Visit (LOCAL_COMMUNITY_HEALTH_CENTER): Payer: Medicaid Other

## 2021-07-17 ENCOUNTER — Other Ambulatory Visit: Payer: Self-pay

## 2021-07-17 VITALS — BP 110/76 | Ht 68.0 in | Wt 182.5 lb

## 2021-07-17 DIAGNOSIS — Z3201 Encounter for pregnancy test, result positive: Secondary | ICD-10-CM | POA: Diagnosis not present

## 2021-07-17 LAB — PREGNANCY, URINE: Preg Test, Ur: POSITIVE — AB

## 2021-07-17 MED ORDER — PRENATAL 27-0.8 MG PO TABS: 1.0000 | ORAL_TABLET | Freq: Every day | ORAL | 0 refills | Status: AC

## 2021-07-17 NOTE — Progress Notes (Signed)
UPT positive. No period since giving birth 09/07/2020. Had depo injection before leaving hospital. No use of bc since the depo that was given after delivery.  Had +home preg test 01/12/2021. Currently feels fetal movement. Plans prenatal care at [facility]. RN advised pt  to establish prenatal care ASAP. Pt in agreement. Plans to contact DSS/Medicaid worker this week. Jerel Shepherd, RN

## 2021-07-26 ENCOUNTER — Other Ambulatory Visit: Payer: Self-pay

## 2021-07-26 ENCOUNTER — Other Ambulatory Visit (HOSPITAL_COMMUNITY)
Admission: RE | Admit: 2021-07-26 | Discharge: 2021-07-26 | Disposition: A | Discharge status: Home / Self Care | Payer: Medicaid Other | Source: Ambulatory Visit | Attending: Advanced Practice Midwife | Admitting: Advanced Practice Midwife

## 2021-07-26 ENCOUNTER — Ambulatory Visit (INDEPENDENT_AMBULATORY_CARE_PROVIDER_SITE_OTHER): Payer: Medicaid Other | Admitting: Advanced Practice Midwife

## 2021-07-26 ENCOUNTER — Encounter: Payer: Self-pay | Admitting: Advanced Practice Midwife

## 2021-07-26 VITALS — BP 112/69 | Wt 183.0 lb

## 2021-07-26 DIAGNOSIS — Z131 Encounter for screening for diabetes mellitus: Secondary | ICD-10-CM

## 2021-07-26 DIAGNOSIS — Z369 Encounter for antenatal screening, unspecified: Secondary | ICD-10-CM | POA: Insufficient documentation

## 2021-07-26 DIAGNOSIS — O0933 Supervision of pregnancy with insufficient antenatal care, third trimester: Secondary | ICD-10-CM

## 2021-07-26 DIAGNOSIS — Z3482 Encounter for supervision of other normal pregnancy, second trimester: Secondary | ICD-10-CM | POA: Insufficient documentation

## 2021-07-26 DIAGNOSIS — Z3A29 29 weeks gestation of pregnancy: Secondary | ICD-10-CM

## 2021-07-26 DIAGNOSIS — Z113 Encounter for screening for infections with a predominantly sexual mode of transmission: Secondary | ICD-10-CM | POA: Insufficient documentation

## 2021-07-26 DIAGNOSIS — Z1159 Encounter for screening for other viral diseases: Secondary | ICD-10-CM

## 2021-07-26 DIAGNOSIS — Z130 Encounter for screening for diseases of the blood and blood-forming organs and certain disorders involving the immune mechanism: Secondary | ICD-10-CM | POA: Diagnosis not present

## 2021-07-26 NOTE — Progress Notes (Addendum)
New Obstetric Patient H&P    Chief Complaint: "Desires prenatal care"   History of Present Illness: Patient is a 30 y.o. [id number] Not Hispanic or Latino female, presents with amenorrhea and positive home pregnancy test. Patient's last delivery was 09/07/2020. She had a Depo shot prior to leaving the hospital and did not have the next shot at the end of February. She had no menstrual period. First positive home test was 01/12/21. LMP/conception date unknown. Working EDD assigned today is loosely based on date of positive home test and fundal height. Her last pap smear was 1 years ago and was no abnormalities. She has a history of LEEP in 2020.  *Update to visit* EDD is changed to 11/17/2021 for 6 week difference.   She had a urine pregnancy test which was positive 6 or 7 month(s)  ago. She claims she has experienced breast tenderness, fatigue, nausea, vomiting. She denies vaginal bleeding. Her past medical history is noncontributory. Her prior pregnancies are notable for G1 preterm 36w 2010 6#9oz female SVD, G2 TAB 2019, G3 38w 2021 7# female SVD.  Since her LMP, she admits to the use of tobacco products  no She claims she has gained  18  pounds since the start of her pregnancy.  There are cats in the home in the home  no  She admits close contact with children on a regular basis  yes  She has had chicken pox in the past no She has had Tuberculosis exposures, symptoms, or previously tested positive for TB   no Current or past history of domestic violence. no  Genetic Screening/Teratology Counseling: (Includes patient, baby's father, or anyone in either family with:)   1. Patient's age >/= 73 at [facility]  no 2. Thalassemia ([place], [place], Mediterranean, or Asian background): MCV<80  no 3. Neural tube defect (meningomyelocele, spina bifida, anencephaly)  no 4. Congenital heart defect  no  5. Down syndrome  no 6. Tay-Sachs (Jewish, [place]  no 7. Canavan's Disease  no 8. Sickle cell  disease or trait (African)  no  9. Hemophilia or other blood disorders  no  10. Muscular dystrophy  no  11. Cystic fibrosis  no  12. Huntington's Chorea  no  13. Mental retardation/autism  no 14. Other inherited genetic or chromosomal disorder  no 15. Maternal metabolic disorder (DM, PKU, etc)  no 16. Patient or FOB with a child with a birth defect not listed above G3 with multicystic dysplastic kidney and has 1 functioning kidney 16a. Patient or FOB with a birth defect themselves no 17. Recurrent pregnancy loss, or stillbirth  no  18. Any medications since LMP other than prenatal vitamins (include vitamins, supplements, OTC meds, drugs, alcohol)  no 19. Any other genetic/environmental exposure to discuss  no  Infection History:   1. Lives with someone with TB or TB exposed  no  2. Patient or partner has history of genital herpes  no 3. Rash or viral illness since LMP  no 4. History of STI (GC, CT, HPV, syphilis, HIV)  no 5. History of recent travel :  no  Other pertinent information:  Patient works at [employer] facility    Review of Systems:10 point review of systems negative unless otherwise noted in HPI  Past Medical History:  Patient Active Problem List   Diagnosis Date Noted   Supervision of normal pregnancy 03/10/2020     Nursing Staff Provider  Office Location  [facility] Dating  By 24w u/s EDD 11/17/21  Language  English Anatomy US  Complete, normal 08/01/21  Flu Vaccine   Genetic Screen  NIPS:   TDaP vaccine    Hgb A1C or  GTT Early : Third trimester :   Covid    LAB RESULTS   Rhogam   Blood Type --/--/B POS Performed at [facility], 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 Dysplasia Hx:  1. 02/09/18: ASC-H, trichomonas. Trichomonas treated  2. 03/25/18: 5'o'clock cervical bx CIN2-3; ECC CIN 1 > LEEP (patient lost to follow up and did not present for LEEP) 3. 09/16/18: Pap HSIL ; Cervical bx 11 and 6 o'clock CIN 3; ECC CIN 3 > LEEP  4. 11/18/18: LEEP [ ]      Past Surgical History:  Past Surgical History:  Procedure Laterality Date   cerivcal Leep     01/2019    Gynecologic History: Patient's last menstrual period was 12/29/2020 (within weeks).  Obstetric History5/2020  Family History:  Family History  Problem Relation Age of Onset   Diabetes Father    Skin cancer Father    Prostate cancer Paternal Grandfather     Social History:  Social History   Socioeconomic History   Marital status: Significant Other    Spouse name: [name]   Number of children: Not on file   Years of education: Not on file   Highest education level: Not on file  Occupational History   Not on file  Tobacco Use   Smoking status: Former    Packs/day: 0.50    Types: Cigarettes   Smokeless tobacco: Never   Tobacco comments:    Stopped smoking 01/13/2020 when found out pregnant  Vaping Use   Vaping Use: Never used  Substance and Sexual Activity   Alcohol use: Not Currently    Comment: last use 09/2020   Drug use: Never   Sexual activity: Yes    Partners: Male    Birth control/protection: None  Other Topics Concern   Not on file  Social History Narrative   Not on file   Social Determinants of Health   Financial Resource Strain: Not on file  Food Insecurity: Not on file   Transportation Needs: Not on file  Physical Activity: Not on file  Stress: Not on file  Social Connections: Not on file  Intimate Partner Violence: Not At Risk   Fear of Current or Ex-Partner: No   Emotionally Abused: No   Physically Abused: No   Sexually Abused: No    Allergies:  No Known Allergies  Medications: Prior to Admission medications   Medication Sig Start Date End Date Taking? Authorizing Provider  Prenatal Vit-Fe Fumarate-FA (MULTIVITAMIN-PRENATAL)  27-0.8 MG TABS tablet Take 1 tablet by mouth daily at 12 noon. 07/17/21 10/25/21 Yes Federico Flake, MD    Physical Exam Vitals: Blood pressure 112/69, weight 183 lb (83 kg), last menstrual period 12/29/2020  General: NAD HEENT: normocephalic, anicteric Thyroid: no enlargement, no palpable nodules Pulmonary: No increased work of breathing, CTAB Cardiovascular: RRR, distal pulses 2+ Abdomen: NABS, soft, non-tender, non-distended.  Umbilicus without lesions.  No hepatomegaly, splenomegaly or masses palpable. No evidence of hernia. Fundal Height 28 cm, FHTs 130s  Genitourinary:  External: Normal external female genitalia.  Normal urethral meatus, normal  Bartholin's and Skene's glands.    Vagina: Normal vaginal mucosa, no evidence of prolapse.   Extremities: no edema, erythema, or tenderness Neurologic: Grossly intact Psychiatric: mood appropriate, affect full   The following were addressed during this visit:  Breastfeeding Education - Early initiation of breastfeeding    Comments: Keeps milk supply adequate, helps contract uterus and slow bleeding, and early milk is the perfect first food and is easy to digest.   - The importance of exclusive breastfeeding    Comments: Provides antibodies, Lower risk of breast and ovarian cancers, and type-2 diabetes,Helps your body recover, Reduced chance of SIDS.   - Risks of giving your baby anything other than breast milk if you are breastfeeding    Comments: Make the  baby less content with breastfeeds, may make my baby more susceptible to illness, and may reduce my milk supply.   - The importance of early skin-to-skin contact    Comments:  Keeps baby warm and secure, helps keep baby's blood sugar up and breathing steady, easier to bond and breastfeed, and helps calm baby.  - Rooming-in on a 24-hour basis    Comments: Easier to learn baby's feeding cues, easier to bond and get to know each other, and encourages milk production.   - Feeding on demand or baby-led feeding    Comments: Helps prevent breastfeeding complications, helps bring in good milk supply, prevents under or overfeeding, and helps baby feel content and satisfied   - Frequent feeding to help assure optimal milk production    Comments: Making a full supply of milk requires frequent removal of milk from breasts, infant will eat 8-12 times in 24 hours, if separated from infant use breast massage, hand expression and/ or pumping to remove milk from breasts.   - Effective positioning and attachment    Comments: Helps my baby to get enough breast milk, helps to produce an adequate milk supply, and helps prevent nipple pain and damage   - Exclusive breastfeeding for the first 6 months    Comments: Builds a healthy milk supply and keeps it up, protects baby from sickness and disease, and breastmilk has everything your baby needs for the first 6 months.  - Individualized Education    Comments: Contraindications to breastfeeding and other special medical conditions Attempted to feed G1 (preterm) without success. Did not breastfeed G3.    Assessment: 30 y.o. [id number] at [redacted]w[redacted]d presenting to initiate prenatal care  Plan: 1) Avoid alcoholic beverages. 2) Patient encouraged not to smoke.  3) Discontinue the use of all non-medicinal drugs and chemicals.  4) Take prenatal vitamins daily.  5) Nutrition, food safety (fish, cheese advisories, and high nitrite foods) and exercise discussed. 6)  Hospital and practice style discussed with cross coverage system.  7) Genetic Screening, such as with 1st Trimester Screening, cell free fetal DNA, AFP testing, and Ultrasound, as well as with amniocentesis and CVS  as appropriate, is discussed with patient. At the conclusion of today's visit patient requested genetic testing 8) Patient is asked about travel to areas at risk for the Zika virus, and counseled to avoid travel and exposure to mosquitoes or sexual partners who may have themselves been exposed to the virus. Testing is discussed, and will be ordered as appropriate.  9) Urine culture, aptima today 10) Return to clinic in 1 week for anatomy scan/dating (asap) and ROB, NOB labs including 1 hr gtt and MaterniT 21   Tresea Mall, CNM [facility] 07/26/2021, 4:52 PM

## 2021-07-26 NOTE — Progress Notes (Signed)
NOB- declines flu shot, no concerns 

## 2021-07-26 NOTE — Patient Instructions (Signed)

## 2021-07-29 LAB — URINE CULTURE

## 2021-07-30 LAB — CERVICOVAGINAL ANCILLARY ONLY
Chlamydia: NEGATIVE
Comment: NEGATIVE
Comment: NEGATIVE
Comment: NORMAL
Neisseria Gonorrhea: NEGATIVE
Trichomonas: NEGATIVE

## 2021-08-01 ENCOUNTER — Ambulatory Visit
Admission: RE | Admit: 2021-08-01 | Discharge: 2021-08-01 | Disposition: A | Discharge status: Home / Self Care | Payer: Medicaid Other | Source: Ambulatory Visit | Attending: Advanced Practice Midwife | Admitting: Advanced Practice Midwife

## 2021-08-01 ENCOUNTER — Other Ambulatory Visit: Payer: Self-pay

## 2021-08-01 DIAGNOSIS — Z3482 Encounter for supervision of other normal pregnancy, second trimester: Secondary | ICD-10-CM | POA: Diagnosis not present

## 2021-08-01 DIAGNOSIS — Z369 Encounter for antenatal screening, unspecified: Secondary | ICD-10-CM | POA: Insufficient documentation

## 2021-08-06 ENCOUNTER — Encounter: Payer: Self-pay | Admitting: Obstetrics and Gynecology

## 2021-08-06 ENCOUNTER — Other Ambulatory Visit: Payer: Medicaid Other

## 2021-08-06 ENCOUNTER — Ambulatory Visit (INDEPENDENT_AMBULATORY_CARE_PROVIDER_SITE_OTHER): Payer: Medicaid Other | Admitting: Obstetrics and Gynecology

## 2021-08-06 ENCOUNTER — Other Ambulatory Visit: Payer: Self-pay

## 2021-08-06 VITALS — BP 114/70 | Ht 68.0 in | Wt 184.6 lb

## 2021-08-06 DIAGNOSIS — Z130 Encounter for screening for diseases of the blood and blood-forming organs and certain disorders involving the immune mechanism: Secondary | ICD-10-CM

## 2021-08-06 DIAGNOSIS — Z23 Encounter for immunization: Secondary | ICD-10-CM

## 2021-08-06 DIAGNOSIS — Z113 Encounter for screening for infections with a predominantly sexual mode of transmission: Secondary | ICD-10-CM

## 2021-08-06 DIAGNOSIS — Z1379 Encounter for other screening for genetic and chromosomal anomalies: Secondary | ICD-10-CM

## 2021-08-06 DIAGNOSIS — Z3482 Encounter for supervision of other normal pregnancy, second trimester: Secondary | ICD-10-CM | POA: Diagnosis not present

## 2021-08-06 DIAGNOSIS — Z369 Encounter for antenatal screening, unspecified: Secondary | ICD-10-CM

## 2021-08-06 DIAGNOSIS — Z1159 Encounter for screening for other viral diseases: Secondary | ICD-10-CM

## 2021-08-06 DIAGNOSIS — Z3A25 25 weeks gestation of pregnancy: Secondary | ICD-10-CM

## 2021-08-06 DIAGNOSIS — Z131 Encounter for screening for diabetes mellitus: Secondary | ICD-10-CM

## 2021-08-06 NOTE — Progress Notes (Signed)
Routine Prenatal Care Visit  Subjective  Sharon Lambert is a 30 y.o. [phone] at [redacted]w[redacted]d being seen today for ongoing prenatal care.  She is currently monitored for the following issues for this low-risk pregnancy and has Cervical dysplasia; Supervision of normal pregnancy; History of loop electrosurgical excision procedure (LEEP); and Late prenatal care affecting pregnancy in third trimester on their problem list.  ----------------------------------------------------------------------------------- Patient reports no complaints.   Contractions: Not present. Vag. Bleeding: None.  Movement: Present. Denies leaking of fluid.  ----------------------------------------------------------------------------------- The following portions of the patient's history were reviewed and updated as appropriate: allergies, current medications, past family history, past medical history, past social history, past surgical history and problem list. Problem list updated.   Objective  Blood pressure 114/70, height 5\' 8"  (1.727 m), weight 184 lb 9.6 oz (83.7 kg), last menstrual period 12/29/2020, not currently breastfeeding. Pregravid weight 165 lb (74.8 kg) Total Weight Gain 19 lb 9.6 oz (8.891 kg) Urinalysis:      Fetal Status: Fetal Heart Rate (bpm): 140   Movement: Present     General:  Alert, oriented and cooperative. Patient is in no acute distress.  Skin: Skin is warm and dry. No rash noted.   Cardiovascular: Normal heart rate noted  Respiratory: Normal respiratory effort, no problems with respiration noted  Abdomen: Soft, gravid, appropriate for gestational age. Pain/Pressure: Absent     Pelvic:  Cervical exam deferred        Extremities: Normal range of motion.  Edema: None  Mental Status: Normal mood and affect. Normal behavior. Normal judgment and thought content.     Assessment   30 y.o. 34 at [redacted]w[redacted]d by  11/17/2021, by Ultrasound presenting for routine prenatal visit  Plan   THIRD  Problems (from 07/26/21 to present)     Problem Noted Resolved   Late prenatal care affecting pregnancy in third trimester 07/26/2021 by 07/28/2021, CNM No   Supervision of normal pregnancy 03/10/2020 by 03/12/2020, CNM No   Overview Addendum 08/06/2021 11:39 AM by 08/08/2021, MD     Nursing Staff Provider  Office Location  [facility] Dating  By 24w u/s EDD 11/17/21  Language  English Anatomy 01/15/22  Complete/normal 08/01/21  Flu Vaccine   Genetic Screen  NIPS:   TDaP vaccine    Hgb A1C or  GTT Third trimester :   Covid    LAB RESULTS   Rhogam  Not needed Blood Type B POS  Feeding Plan  Antibody NEG (11/24 1045)  Contraception  Rubella    Circumcision  RPR NON REACTIVE (11/24 1140)   Pediatrician   HBsAg     Support Person  HIV    Prenatal Classes Discussed Varicella     GBS Negative/-- (11/12 1427)(For PCN allergy, check sensitivities)   BTL Consent Consent 08/06/2021    VBAC Consent  Pap  NIL 2021    Hgb Electro    Pelvis Tested  CF      SMA        Late to prenatal care 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  Return in about 2 weeks (around 08/20/2021) for ROB in person.  Natale Milch MD [facility], [facility] 08/06/2021, 11:41 AM

## 2021-08-06 NOTE — Patient Instructions (Signed)

## 2021-08-08 LAB — HGB FRACTIONATION CASCADE
Hgb A2: 2.5 % (ref 1.8–3.2)
Hgb A: 96.4 % (ref 96.4–98.8)
Hgb F: 1.1 % (ref 0.0–2.0)
Hgb S: 0 %

## 2021-08-08 LAB — RPR+RH+ABO+RUB AB+AB SCR+CB...
Antibody Screen: NEGATIVE
HIV Screen 4th Generation wRfx: NONREACTIVE
Hematocrit: 32.7 % — ABNORMAL LOW (ref 34.0–46.6)
Hemoglobin: 11.4 g/dL (ref 11.1–15.9)
Hepatitis B Surface Ag: NEGATIVE
MCH: 32.8 pg (ref 26.6–33.0)
MCHC: 34.9 g/dL (ref 31.5–35.7)
MCV: 94 fL (ref 79–97)
Platelets: 217 10*3/uL (ref 150–450)
RBC: 3.48 x10E6/uL — ABNORMAL LOW (ref 3.77–5.28)
RDW: 11.8 % (ref 11.7–15.4)
RPR Ser Ql: NONREACTIVE
Rh Factor: POSITIVE
Rubella Antibodies, IGG: 3.71 index (ref >0.99)
Varicella zoster IgG: 664 index (ref >165)
WBC: 8.3 10*3/uL (ref 3.4–10.8)

## 2021-08-08 LAB — HEPATITIS C ANTIBODY: Hep C Virus Ab: <0.1 s/co ratio (ref 0.0–0.9)

## 2021-08-08 LAB — GLUCOSE, 1 HOUR GESTATIONAL: Gestational Diabetes Screen: 104 mg/dL (ref 70–139)

## 2021-08-09 ENCOUNTER — Other Ambulatory Visit: Payer: Medicaid Other

## 2021-08-11 LAB — MATERNIT21 PLUS CORE+SCA
Fetal Fraction: 14
Monosomy X (Turner Syndrome): NOT DETECTED
Result (T21): NEGATIVE
Trisomy 13 (Patau syndrome): NEGATIVE
Trisomy 18 (Edwards syndrome): NEGATIVE
Trisomy 21 (Down syndrome): NEGATIVE
XXX (Triple X Syndrome): NOT DETECTED
XXY (Klinefelter Syndrome): NOT DETECTED
XYY (Jacobs Syndrome): NOT DETECTED

## 2021-08-20 ENCOUNTER — Encounter: Payer: Medicaid Other | Admitting: Obstetrics and Gynecology

## 2021-10-14 NOTE — L&D Delivery Note (Signed)
Delivery Note At  0723, a viable female was delivered vaginally in one extended push over an intact perineum. The baby presented OA, and a tight nuchal cord was noted, that was not reducible on the perineum. The baby was delivered easily and somersaulted through the cord. The shoulders required no gentle traction for delivery, appearing readily. APGAR: 9,9, ; weight  pending.   Placenta status: the cord was friable and after over 15 minutes and adequate pushing by the mother was assisted in delivery via manual removal ,  .  Cord: 3 vessel  with the following complications: manual removal of the placenta .    Anesthesia:  epidural Episiotomy:  none Lacerations:  none Suture Repair:  NA Est. Blood Loss 200(mL):    Mom to postpartum.  Baby to Couplet care / Skin to Skin. The mother desires a BTL.NPO after midnight  [name] 11/06/2021, 7:56 AM

## 2021-11-06 ENCOUNTER — Encounter: Payer: Self-pay | Admitting: Obstetrics & Gynecology

## 2021-11-06 ENCOUNTER — Inpatient Hospital Stay: Payer: Medicaid Other | Admitting: Anesthesiology

## 2021-11-06 ENCOUNTER — Other Ambulatory Visit: Payer: Self-pay

## 2021-11-06 ENCOUNTER — Inpatient Hospital Stay
Admission: EM | Admit: 2021-11-06 | Discharge: 2021-11-07 | DRG: 797 | Disposition: A | Discharge status: Home / Self Care | Payer: Medicaid Other | Attending: Obstetrics & Gynecology | Admitting: Obstetrics & Gynecology

## 2021-11-06 DIAGNOSIS — Z302 Encounter for sterilization: Secondary | ICD-10-CM | POA: Diagnosis not present

## 2021-11-06 DIAGNOSIS — F1290 Cannabis use, unspecified, uncomplicated: Secondary | ICD-10-CM | POA: Diagnosis present

## 2021-11-06 DIAGNOSIS — D62 Acute posthemorrhagic anemia: Secondary | ICD-10-CM | POA: Diagnosis not present

## 2021-11-06 DIAGNOSIS — O479 False labor, unspecified: Secondary | ICD-10-CM

## 2021-11-06 DIAGNOSIS — Z20822 Contact with and (suspected) exposure to covid-19: Secondary | ICD-10-CM | POA: Diagnosis present

## 2021-11-06 DIAGNOSIS — O99324 Drug use complicating childbirth: Secondary | ICD-10-CM

## 2021-11-06 DIAGNOSIS — O9081 Anemia of the puerperium: Secondary | ICD-10-CM | POA: Diagnosis not present

## 2021-11-06 DIAGNOSIS — Z87891 Personal history of nicotine dependence: Secondary | ICD-10-CM

## 2021-11-06 DIAGNOSIS — Z3A38 38 weeks gestation of pregnancy: Secondary | ICD-10-CM | POA: Diagnosis not present

## 2021-11-06 DIAGNOSIS — Z9889 Other specified postprocedural states: Secondary | ICD-10-CM

## 2021-11-06 DIAGNOSIS — Z23 Encounter for immunization: Secondary | ICD-10-CM | POA: Diagnosis not present

## 2021-11-06 DIAGNOSIS — N879 Dysplasia of cervix uteri, unspecified: Secondary | ICD-10-CM

## 2021-11-06 DIAGNOSIS — F1210 Cannabis abuse, uncomplicated: Secondary | ICD-10-CM

## 2021-11-06 DIAGNOSIS — O0933 Supervision of pregnancy with insufficient antenatal care, third trimester: Secondary | ICD-10-CM

## 2021-11-06 DIAGNOSIS — O26893 Other specified pregnancy related conditions, third trimester: Secondary | ICD-10-CM | POA: Diagnosis present

## 2021-11-06 DIAGNOSIS — O9903 Anemia complicating the puerperium: Secondary | ICD-10-CM

## 2021-11-06 DIAGNOSIS — Z3482 Encounter for supervision of other normal pregnancy, second trimester: Secondary | ICD-10-CM

## 2021-11-06 LAB — CBC
HCT: 34.7 % — ABNORMAL LOW (ref 36.0–46.0)
Hemoglobin: 11.9 g/dL — ABNORMAL LOW (ref 12.0–15.0)
MCH: 32.2 pg (ref 26.0–34.0)
MCHC: 34.3 g/dL (ref 30.0–36.0)
MCV: 93.8 fL (ref 80.0–100.0)
Platelets: 178 10*3/uL (ref 150–400)
RBC: 3.7 MIL/uL — ABNORMAL LOW (ref 3.87–5.11)
RDW: 12.5 % (ref 11.5–15.5)
WBC: 8 10*3/uL (ref 4.0–10.5)
nRBC: 0 % (ref 0.0–0.2)

## 2021-11-06 LAB — RESP PANEL BY RT-PCR (FLU A&B, COVID) ARPGX2
Influenza A by PCR: NEGATIVE
Influenza B by PCR: NEGATIVE
SARS Coronavirus 2 by RT PCR: NEGATIVE

## 2021-11-06 LAB — URINE DRUG SCREEN, QUALITATIVE (ARMC ONLY)
Amphetamines, Ur Screen: NOT DETECTED
Barbiturates, Ur Screen: NOT DETECTED
Benzodiazepine, Ur Scrn: NOT DETECTED
Cannabinoid 50 Ng, Ur ~~LOC~~: POSITIVE — AB
Cocaine Metabolite,Ur ~~LOC~~: NOT DETECTED
MDMA (Ecstasy)Ur Screen: NOT DETECTED
Methadone Scn, Ur: NOT DETECTED
Opiate, Ur Screen: NOT DETECTED
Phencyclidine (PCP) Ur S: NOT DETECTED
Tricyclic, Ur Screen: NOT DETECTED

## 2021-11-06 LAB — TYPE AND SCREEN
ABO/RH(D): B POS
Antibody Screen: NEGATIVE

## 2021-11-06 LAB — GROUP B STREP BY PCR: Group B strep by PCR: NEGATIVE

## 2021-11-06 LAB — RPR: RPR Ser Ql: NONREACTIVE

## 2021-11-06 MED ORDER — PENICILLIN G POT IN DEXTROSE 60000 UNIT/ML IV SOLN
3.0000 10*6.[IU] | INTRAVENOUS | Status: DC
  Filled 2021-11-06 (×3): qty 50

## 2021-11-06 MED ORDER — WITCH HAZEL-GLYCERIN EX PADS
1.0000 "application " | MEDICATED_PAD | CUTANEOUS | Status: DC | PRN
  Filled 2021-11-06: qty 100

## 2021-11-06 MED ORDER — OXYTOCIN BOLUS FROM INFUSION
333.0000 mL | Freq: Once | INTRAVENOUS | Status: AC
  Administered 2021-11-06: 07:00:00 333 mL via INTRAVENOUS

## 2021-11-06 MED ORDER — FENTANYL-BUPIVACAINE-NACL 0.5-0.125-0.9 MG/250ML-% EP SOLN
12.0000 mL/h | EPIDURAL | Status: DC | PRN
  Administered 2021-11-06: 06:00:00 12 mL/h via EPIDURAL
  Filled 2021-11-06: qty 250

## 2021-11-06 MED ORDER — LIDOCAINE-EPINEPHRINE (PF) 1.5 %-1:200000 IJ SOLN
INTRAMUSCULAR | Status: DC | PRN
  Administered 2021-11-06: 3 mL via EPIDURAL

## 2021-11-06 MED ORDER — LIDOCAINE HCL (PF) 1 % IJ SOLN
30.0000 mL | INTRAMUSCULAR | Status: DC | PRN
  Filled 2021-11-06: qty 30

## 2021-11-06 MED ORDER — PRENATAL MULTIVITAMIN CH
1.0000 | ORAL_TABLET | Freq: Every day | ORAL | Status: DC
  Filled 2021-11-06: qty 1

## 2021-11-06 MED ORDER — COCONUT OIL OIL: 1.0000 "application " | TOPICAL_OIL | Status: DC | PRN

## 2021-11-06 MED ORDER — LACTATED RINGERS IV SOLN: 500.0000 mL | Freq: Once | INTRAVENOUS | Status: DC

## 2021-11-06 MED ORDER — SODIUM CHLORIDE 0.9 % IV SOLN
5.0000 10*6.[IU] | Freq: Once | INTRAVENOUS | Status: AC
  Administered 2021-11-06: 06:00:00 5 10*6.[IU] via INTRAVENOUS

## 2021-11-06 MED ORDER — EPHEDRINE 5 MG/ML INJ
10.0000 mg | INTRAVENOUS | Status: DC | PRN
  Filled 2021-11-06: qty 2

## 2021-11-06 MED ORDER — SOD CITRATE-CITRIC ACID 500-334 MG/5ML PO SOLN: 30.0000 mL | ORAL | Status: DC | PRN

## 2021-11-06 MED ORDER — SIMETHICONE 80 MG PO CHEW: 80.0000 mg | CHEWABLE_TABLET | ORAL | Status: DC | PRN

## 2021-11-06 MED ORDER — OXYCODONE HCL 5 MG PO TABS: 5.0000 mg | ORAL_TABLET | ORAL | Status: DC | PRN

## 2021-11-06 MED ORDER — ZOLPIDEM TARTRATE 5 MG PO TABS: 5.0000 mg | ORAL_TABLET | Freq: Every evening | ORAL | Status: DC | PRN

## 2021-11-06 MED ORDER — PHENYLEPHRINE 40 MCG/ML (10ML) SYRINGE FOR IV PUSH (FOR BLOOD PRESSURE SUPPORT)
80.0000 ug | PREFILLED_SYRINGE | INTRAVENOUS | Status: DC | PRN
  Filled 2021-11-06: qty 10

## 2021-11-06 MED ORDER — OXYCODONE-ACETAMINOPHEN 5-325 MG PO TABS: 2.0000 | ORAL_TABLET | ORAL | Status: DC | PRN

## 2021-11-06 MED ORDER — DIPHENHYDRAMINE HCL 50 MG/ML IJ SOLN: 12.5000 mg | INTRAMUSCULAR | Status: DC | PRN

## 2021-11-06 MED ORDER — FERROUS SULFATE 325 (65 FE) MG PO TABS
325.0000 mg | ORAL_TABLET | Freq: Two times a day (BID) | ORAL | Status: DC
  Administered 2021-11-06: 17:00:00 325 mg via ORAL
  Filled 2021-11-06: qty 1

## 2021-11-06 MED ORDER — CARBOPROST TROMETHAMINE 250 MCG/ML IM SOLN
INTRAMUSCULAR | Status: AC
  Filled 2021-11-06: qty 1

## 2021-11-06 MED ORDER — TETANUS-DIPHTH-ACELL PERTUSSIS 5-2.5-18.5 LF-MCG/0.5 IM SUSY
0.5000 mL | PREFILLED_SYRINGE | Freq: Once | INTRAMUSCULAR | Status: AC
  Administered 2021-11-07: 17:00:00 0.5 mL via INTRAMUSCULAR
  Filled 2021-11-06: qty 0.5

## 2021-11-06 MED ORDER — OXYCODONE HCL 5 MG PO TABS: 10.0000 mg | ORAL_TABLET | ORAL | Status: DC | PRN

## 2021-11-06 MED ORDER — LIDOCAINE HCL (PF) 1 % IJ SOLN
INTRAMUSCULAR | Status: DC | PRN
  Administered 2021-11-06: 3 mL via SUBCUTANEOUS

## 2021-11-06 MED ORDER — IBUPROFEN 600 MG PO TABS
600.0000 mg | ORAL_TABLET | Freq: Four times a day (QID) | ORAL | Status: DC
  Administered 2021-11-06: 17:00:00 600 mg via ORAL
  Filled 2021-11-06: qty 1

## 2021-11-06 MED ORDER — SODIUM CHLORIDE 0.9 % IV SOLN
INTRAVENOUS | Status: DC | PRN
  Administered 2021-11-06 (×2): 5 mL via EPIDURAL

## 2021-11-06 MED ORDER — DOCUSATE SODIUM 100 MG PO CAPS: 100.0000 mg | ORAL_CAPSULE | Freq: Two times a day (BID) | ORAL | Status: DC

## 2021-11-06 MED ORDER — ONDANSETRON HCL 4 MG/2ML IJ SOLN: 4.0000 mg | INTRAMUSCULAR | Status: DC | PRN

## 2021-11-06 MED ORDER — ONDANSETRON HCL 4 MG PO TABS: 4.0000 mg | ORAL_TABLET | ORAL | Status: DC | PRN

## 2021-11-06 MED ORDER — SODIUM CHLORIDE 0.9 % IV SOLN
INTRAVENOUS | Status: AC
  Filled 2021-11-06: qty 5

## 2021-11-06 MED ORDER — DIPHENHYDRAMINE HCL 25 MG PO CAPS: 25.0000 mg | ORAL_CAPSULE | Freq: Four times a day (QID) | ORAL | Status: DC | PRN

## 2021-11-06 MED ORDER — OXYTOCIN-SODIUM CHLORIDE 30-0.9 UT/500ML-% IV SOLN
2.5000 [IU]/h | INTRAVENOUS | Status: DC
  Filled 2021-11-06: qty 500

## 2021-11-06 MED ORDER — ONDANSETRON HCL 4 MG/2ML IJ SOLN: 4.0000 mg | Freq: Four times a day (QID) | INTRAMUSCULAR | Status: DC | PRN

## 2021-11-06 MED ORDER — DIBUCAINE (PERIANAL) 1 % EX OINT: 1.0000 "application " | TOPICAL_OINTMENT | CUTANEOUS | Status: DC | PRN

## 2021-11-06 MED ORDER — BENZOCAINE-MENTHOL 20-0.5 % EX AERO
1.0000 "application " | INHALATION_SPRAY | CUTANEOUS | Status: DC | PRN
  Filled 2021-11-06: qty 56

## 2021-11-06 MED ORDER — LIDOCAINE HCL (PF) 1 % IJ SOLN
INTRAMUSCULAR | Status: AC
  Filled 2021-11-06: qty 30

## 2021-11-06 MED ORDER — LACTATED RINGERS IV SOLN: 500.0000 mL | INTRAVENOUS | Status: DC | PRN

## 2021-11-06 MED ORDER — OXYTOCIN 10 UNIT/ML IJ SOLN
INTRAMUSCULAR | Status: AC
  Filled 2021-11-06: qty 2

## 2021-11-06 MED ORDER — LACTATED RINGERS IV SOLN: INTRAVENOUS | Status: DC

## 2021-11-06 MED ORDER — MISOPROSTOL 200 MCG PO TABS
ORAL_TABLET | ORAL | Status: AC
  Filled 2021-11-06: qty 4

## 2021-11-06 MED ORDER — AMMONIA AROMATIC IN INHA
RESPIRATORY_TRACT | Status: AC
  Filled 2021-11-06: qty 10

## 2021-11-06 MED ORDER — OXYCODONE-ACETAMINOPHEN 5-325 MG PO TABS: 1.0000 | ORAL_TABLET | ORAL | Status: DC | PRN

## 2021-11-06 MED ORDER — ACETAMINOPHEN 325 MG PO TABS: 650.0000 mg | ORAL_TABLET | ORAL | Status: DC | PRN

## 2021-11-06 MED ORDER — METHYLERGONOVINE MALEATE 0.2 MG/ML IJ SOLN
INTRAMUSCULAR | Status: AC
  Filled 2021-11-06: qty 1

## 2021-11-06 NOTE — Progress Notes (Signed)
Discussed with patient options for contraception and tubal ligation Pt continues to express strong desire for tubal (permanent sterilization). Plan for tomorrow (pt has eaten, pt preference)  The patient has been fully informed about all methods of contraception, both temporary and permanent. She understands that tubal ligation is meant to be permanent, absolute and irreversible. She was told that there is an approximately 1 in 400 chance of a pregnancy in the future after tubal ligation. She was told the short and long term complications of tubal ligation. She understands the risks from this surgery include, but are not limited to, the risks of anesthesia, hemorrhage, infection, perforation, and injury to adjacent structures, bowel, bladder and blood vessels.   Barnett Applebaum, MD, [facility], [facility] 11/06/2021  9:38 AM

## 2021-11-06 NOTE — Discharge Summary (Signed)
Postpartum Discharge Summary  Date of Service updated1/25/2023     Patient Name: Sharon Lambert DOB: 12/18/1990 MRN: [id number]  Date of admission: 11/06/2021 Delivery date:11/06/2021  Delivering provider: Imagene Riches  Date of discharge: 11/07/2021  Admitting diagnosis: Uterine contractions [O47.9] Intrauterine pregnancy: [redacted]w[redacted]d    Secondary diagnosis:  Principal Problem:   Uterine contractions Active Problems:   Postpartum care following vaginal delivery  Additional problems: postaprtum tubal ligation, MJ use in pregnancy, limited prenatal care    Discharge diagnosis: Term Pregnancy Delivered                                              Post partum procedures:postpartum tubal ligation Augmentation: N/A Complications: manual removal of the placenta  Hospital course: Onset of Labor With Vaginal Delivery      31y.o. yo [id number] 350w3das admitted in Active Labor on 11/06/2021. Patient had an uncomplicated labor course as follows:  Membrane Rupture Time/Date: 7:14 AM ,11/06/2021   Delivery Method:Vaginal, Spontaneous  Episiotomy: None  Lacerations:  None  Patient had an uncomplicated postpartum course.  She is ambulating, tolerating a regular diet, passing flatus, and urinating well. S/p Tubal ligation. Discussed at length elevated blood pressures and warning signs. Patient is discharged home in stable condition on 11/07/21.  Newborn Data: Birth date:11/06/2021  Birth time:7:23 AM  Gender:Female  Living status:Living  Apgars:9 ,9  Weight:3120 g   Magnesium Sulfate received: No BMZ received: No Rhophylac:N/A MMR:No T-DaP:Given postpartum Flu: No Transfusion:No  Physical exam  Vitals:   11/07/21 1345 11/07/21 1350 11/07/21 1400 11/07/21 1414  BP: 106/78  110/81 118/90  Pulse: (!) 57 (!) 57 (!) 56 66  Resp: 15 16 18 18   Temp:   97.9 F (36.6 C) 97.6 F (36.4 C)  TempSrc:    Oral  SpO2: 94% 94% 96% 95%  Weight:      Height:       General:  alert Lochia: appropriate Uterine Fundus: firm Incision: glue over site, no signs of infection DVT Evaluation: No evidence of DVT seen on physical exam. Labs: Lab Results  Component Value Date   WBC 9.9 11/07/2021   HGB 11.3 (L) 11/07/2021   HCT 33.4 (L) 11/07/2021   MCV 96.0 11/07/2021   PLT 158 11/07/2021   CMP Latest Ref Rng & Units 11/07/2021  Glucose 70 - 99 mg/dL 72  BUN 6 - 20 mg/dL 6  Creatinine 0.44 - 1.00 mg/dL 0.50  Sodium 135 - 145 mmol/L 136  Potassium 3.5 - 5.1 mmol/L 3.7  Chloride 98 - 111 mmol/L 107  CO2 22 - 32 mmol/L 23  Calcium 8.9 - 10.3 mg/dL 8.3(L)  Total Protein 6.5 - 8.1 g/dL 5.7(L)  Total Bilirubin 0.3 - 1.2 mg/dL 0.8  Alkaline Phos 38 - 126 U/L 200(H)  AST 15 - 41 U/L 16  ALT 0 - 44 U/L 10   Edinburgh Score: Edinburgh Postnatal Depression Scale Screening Tool 11/07/2021  I have been able to laugh and see the funny side of things. 0  I have looked forward with enjoyment to things. 0  I have blamed myself unnecessarily when things went wrong. 0  I have been anxious or worried for no good reason. 0  I have felt scared or panicky for no good reason. 0  Things have been getting on top of me.  0  I have been so unhappy that I have had difficulty sleeping. 0  I have felt sad or miserable. 0  I have been so unhappy that I have been crying. 0  The thought of harming myself has occurred to me. 0  Edinburgh Postnatal Depression Scale Total 0      After visit meds:  Allergies as of 11/07/2021   No Known Allergies      Medication List     STOP taking these medications    multivitamin-prenatal 27-0.8 MG Tabs tablet       TAKE these medications    docusate sodium 100 MG capsule Commonly known as: COLACE Take 1 capsule (100 mg total) by mouth 2 (two) times daily.   ibuprofen 600 MG tablet Commonly known as: ADVIL Take 1 tablet (600 mg total) by mouth every 6 (six) hours.   oxyCODONE-acetaminophen 5-325 MG tablet Commonly known as:  PERCOCET/ROXICET Take 1-2 tablets by mouth every 6 (six) hours as needed for up to 3 days for moderate pain or severe pain.   simethicone 80 MG chewable tablet Commonly known as: MYLICON Chew 1 tablet (80 mg total) by mouth as needed for flatulence.   simethicone 80 MG chewable tablet Commonly known as: MYLICON Chew 1 tablet (80 mg total) by mouth 4 (four) times daily.               Discharge Care Instructions  (From admission, onward)           Start     Ordered   11/07/21 0000  Discharge wound care:       Comments: SHOWER DAILY Wash incision gently with soap and water.  Call office with any drainage, redness, or firmness of the incision.   11/07/21 1509             Discharge home in stable condition Infant Feeding: Bottle Infant Disposition:home with mother Discharge instruction: per After Visit Summary and Postpartum booklet. Activity: Advance as tolerated. Pelvic rest for 6 weeks.  Diet: routine diet Anticipated Birth Control: BTL done PP Postpartum Appointment:6 weeks Additional Postpartum F/U:  1 week for incision and BP check  Future Appointments:No future appointments. Follow up Visit:  [place], [place], CNM Follow up in 6 week(s).   Specialties: Obstetrics, Gynecology Contact information: [address] [place] [address] [phone]         Homero Fellers, MD. Schedule an appointment as soon as possible for a visit in 1 week(s).   Specialty: Obstetrics and Gynecology Why: For incision check Contact information: [place]. [place] [address] [phone]                     11/07/2021 Jillene Bucks [place], CNM

## 2021-11-06 NOTE — Anesthesia Preprocedure Evaluation (Signed)
Anesthesia Evaluation  Patient identified by MRN, date of birth, ID band Patient awake    Reviewed: Allergy & Precautions, H&P , NPO status , Patient's Chart, lab work & pertinent test results  History of Anesthesia Complications Negative for: history of anesthetic complications  Airway Mallampati: II  TM Distance: >3 FB Neck ROM: full    Dental  (+) Teeth Intact, Dental Advidsory Given   Pulmonary neg pulmonary ROS, former smoker,           Cardiovascular Exercise Tolerance: Good (-) hypertension(-) anginanegative cardio ROS       Neuro/Psych negative neurological ROS     GI/Hepatic Neg liver ROS, GERD  ,  Endo/Other  negative endocrine ROS  Renal/GU negative Renal ROS     Musculoskeletal   Abdominal   Peds  Hematology negative hematology ROS (+)   Anesthesia Other Findings Past Medical History: No date: Medical history non-contributory  Past Surgical History: No date: cerivcal Leep     Comment:  01/2019  BMI    Body Mass Index: 29.50 kg/m      Reproductive/Obstetrics (+) Pregnancy                             Anesthesia Physical  Anesthesia Plan  ASA: 2  Anesthesia Plan: Epidural   Post-op Pain Management:    Induction:   PONV Risk Score and Plan:   Airway Management Planned:   Additional Equipment:   Intra-op Plan:   Post-operative Plan:   Informed Consent: I have reviewed the patients History and Physical, chart, labs and discussed the procedure including the risks, benefits and alternatives for the proposed anesthesia with the patient or authorized representative who has indicated his/her understanding and acceptance.       Plan Discussed with: Anesthesiologist  Anesthesia Plan Comments:         Anesthesia Quick Evaluation

## 2021-11-06 NOTE — Anesthesia Procedure Notes (Signed)
Epidural Patient location during procedure: OB Start time: 11/06/2021 6:05 AM End time: 11/06/2021 6:14 AM  Staffing Anesthesiologist: Lenard Simmer, MD Performed: anesthesiologist   Preanesthetic Checklist Completed: patient identified, IV checked, site marked, risks and benefits discussed, surgical consent, monitors and equipment checked, pre-op evaluation and timeout performed  Epidural Patient position: sitting Prep: ChloraPrep Patient monitoring: heart rate, continuous pulse ox and blood pressure Approach: midline Location: L3-L4 Injection technique: LOR saline  Needle:  Needle type: Tuohy  Needle gauge: 17 G Needle length: 9 cm and 9 Needle insertion depth: 5 cm Catheter type: closed end flexible Catheter size: 19 Gauge Catheter at skin depth: 10 cm Test dose: negative and 1.5% lidocaine with Epi 1:200 K  Assessment Sensory level: T10 Events: blood not aspirated, injection not painful, no injection resistance, no paresthesia and negative IV test  Additional Notes 1st attempt Pt. Evaluated and documentation done after procedure finished. Patient identified. Risks/Benefits/Options discussed with patient including but not limited to bleeding, infection, nerve damage, paralysis, failed block, incomplete pain control, headache, blood pressure changes, nausea, vomiting, reactions to medication both or allergic, itching and postpartum back pain. Confirmed with bedside nurse the patient's most recent platelet count. Confirmed with patient that they are not currently taking any anticoagulation, have any bleeding history or any family history of bleeding disorders. Patient expressed understanding and wished to proceed. All questions were answered. Sterile technique was used throughout the entire procedure. Please see nursing notes for vital signs. Test dose was given through epidural catheter and negative prior to continuing to dose epidural or start infusion. Warning signs of high  block given to the patient including shortness of breath, tingling/numbness in hands, complete motor block, or any concerning symptoms with instructions to call for help. Patient was given instructions on fall risk and not to get out of bed. All questions and concerns addressed with instructions to call with any issues or inadequate analgesia.    Patient tolerated the insertion well without immediate complications.Reason for block:procedure for pain

## 2021-11-06 NOTE — H&P (Signed)
Sharon Lambert is a 31 y.o. female presenting for a labor check. She reports starting with regular contractions earlier this evening. She denies any LOF or bloody show. Her prenatal care has been limited, with onset of care in the second trimester. She has had two prenatal visits this pregnancy. Her contractions are still somewhat irregular; she shares a hx of rapid labor with her last baby. OB History     Gravida  4   Para  2   Term  1   Preterm  1   AB  1   Living  2      SAB  0   IAB  1   Ectopic  0   Multiple  0   Live Births  2          Past Medical History:  Diagnosis Date   Medical history non-contributory    Past Surgical History:  Procedure Laterality Date   cerivcal Leep     01/2019   Family History: family history includes Diabetes in her father; Prostate cancer in her paternal grandfather; Skin cancer in her father. Social History:  reports that she has quit smoking. Her smoking use included cigarettes. She smoked an average of .5 packs per day. She has never used smokeless tobacco. She reports that she does not currently use alcohol. She reports that she does not use drugs.     Maternal Diabetes: No Genetic Screening: negative- xy Maternal Ultrasounds/Referrals: Normal Fetal Ultrasounds or other Referrals:  None Maternal Substance Abuse:  Yes:  Type: Marijuana Significant Maternal Medications:  None Significant Maternal Lab Results:  Other: unknown GBS status this pregnancy Other Comments:   late to care; insufficient prenatal care; desires a BTL  Review of Systems  Constitutional: Negative.   HENT: Negative.    Eyes: Negative.   Respiratory: Negative.    Cardiovascular: Negative.   Gastrointestinal: Negative.        Gravid abdomen  History Dilation: 8.5 Effacement (%): 90 Station: -3 Exam by:: Rilen Shukla CNM Blood pressure (!) 144/92, pulse 74, temperature 97.9 F (36.6 C), temperature source Oral, resp. rate 19, height 5\' 8"   (1.727 m), weight 79.8 kg, last menstrual period 12/29/2020, not currently breastfeeding. Maternal Exam:  Introitus: Normal vulva.  Physical Exam Vitals and nursing note reviewed.  Constitutional:      Appearance: Normal appearance.  HENT:     Head: Normocephalic and atraumatic.  Cardiovascular:     Rate and Rhythm: Normal rate and regular rhythm.     Pulses: Normal pulses.     Heart sounds: Normal heart sounds.  Pulmonary:     Effort: Pulmonary effort is normal.     Breath sounds: Normal breath sounds.  Abdominal:     Comments: Gravid abdomen  Genitourinary:    General: Normal vulva.     Rectum: Normal.  Musculoskeletal:        General: Normal range of motion.     Cervical back: Normal range of motion and neck supple.  Skin:    General: Skin is warm and dry.  Neurological:     General: No focal deficit present.     Mental Status: She is alert and oriented to person, place, and time.  Psychiatric:        Mood and Affect: Mood normal.        Behavior: Behavior normal.    Prenatal labs: ABO, Rh: --/--/PENDING (01/24 07-04-1991) Antibody: PENDING (01/24 0426) Rubella: 3.71 (10/24 1147) RPR: Non Reactive (10/24 1147)  HBsAg: Negative (10/24 1147)  HIV: Non Reactive (10/24 1147)  GBS:     Assessment/Plan: IUP 38 weeks 5 days Active labor Category 2 FHTs Gbs unknown + Marijuana use  Plan: admit for labor PCN for unknown gbs status Continuous  EFM Anesthesia contacted for epidural Anticipate svd. Will lmake her NPO after midnight tomnight  for a planned BTL tomorrow.    [name] 11/06/2021, 5:51 AM

## 2021-11-06 NOTE — OB Triage Note (Signed)
Pt is a 30y/o G4P2 at [redacted]w[redacted]d with c/o contractions that began around 12 midnight. Pt rates pain 7/10. Pt states some blood after wiping after a bowel movement. Pt states +FM. Pt denies LOF & CTX. Monitors applied and assessing. Initial FHT 125.

## 2021-11-07 ENCOUNTER — Encounter
Admission: EM | Disposition: A | Discharge status: Home / Self Care | Payer: Self-pay | Source: Home / Self Care | Attending: Obstetrics & Gynecology

## 2021-11-07 ENCOUNTER — Encounter: Payer: Self-pay | Admitting: Obstetrics & Gynecology

## 2021-11-07 ENCOUNTER — Inpatient Hospital Stay: Payer: Medicaid Other | Admitting: Anesthesiology

## 2021-11-07 ENCOUNTER — Other Ambulatory Visit: Payer: Self-pay

## 2021-11-07 DIAGNOSIS — Z302 Encounter for sterilization: Secondary | ICD-10-CM

## 2021-11-07 HISTORY — PX: TUBAL LIGATION: SHX77

## 2021-11-07 LAB — CBC
HCT: 33.4 % — ABNORMAL LOW (ref 36.0–46.0)
Hemoglobin: 11.3 g/dL — ABNORMAL LOW (ref 12.0–15.0)
MCH: 32.5 pg (ref 26.0–34.0)
MCHC: 33.8 g/dL (ref 30.0–36.0)
MCV: 96 fL (ref 80.0–100.0)
Platelets: 158 10*3/uL (ref 150–400)
RBC: 3.48 MIL/uL — ABNORMAL LOW (ref 3.87–5.11)
RDW: 12.8 % (ref 11.5–15.5)
WBC: 9.9 10*3/uL (ref 4.0–10.5)
nRBC: 0 % (ref 0.0–0.2)

## 2021-11-07 LAB — COMPREHENSIVE METABOLIC PANEL
ALT: 10 U/L (ref 0–44)
AST: 16 U/L (ref 15–41)
Albumin: 2.4 g/dL — ABNORMAL LOW (ref 3.5–5.0)
Alkaline Phosphatase: 200 U/L — ABNORMAL HIGH (ref 38–126)
Anion gap: 6 (ref 5–15)
BUN: 6 mg/dL (ref 6–20)
CO2: 23 mmol/L (ref 22–32)
Calcium: 8.3 mg/dL — ABNORMAL LOW (ref 8.9–10.3)
Chloride: 107 mmol/L (ref 98–111)
Creatinine, Ser: 0.5 mg/dL (ref 0.44–1.00)
GFR, Estimated: >60 mL/min (ref >60)
Glucose, Bld: 72 mg/dL (ref 70–99)
Potassium: 3.7 mmol/L (ref 3.5–5.1)
Sodium: 136 mmol/L (ref 135–145)
Total Bilirubin: 0.8 mg/dL (ref 0.3–1.2)
Total Protein: 5.7 g/dL — ABNORMAL LOW (ref 6.5–8.1)

## 2021-11-07 LAB — SURGICAL PATHOLOGY

## 2021-11-07 SURGERY — LIGATION, FALLOPIAN TUBE, POSTPARTUM
Anesthesia: General | Laterality: Bilateral

## 2021-11-07 MED ORDER — ACETAMINOPHEN 325 MG PO TABS: 650.0000 mg | ORAL_TABLET | ORAL | Status: DC | PRN

## 2021-11-07 MED ORDER — GLYCOPYRROLATE 0.2 MG/ML IJ SOLN
INTRAMUSCULAR | Status: DC | PRN
  Administered 2021-11-07: .2 mg via INTRAVENOUS

## 2021-11-07 MED ORDER — LIDOCAINE HCL (CARDIAC) PF 100 MG/5ML IV SOSY
PREFILLED_SYRINGE | INTRAVENOUS | Status: DC | PRN
  Administered 2021-11-07: 100 mg via INTRAVENOUS

## 2021-11-07 MED ORDER — FENTANYL CITRATE (PF) 100 MCG/2ML IJ SOLN: 25.0000 ug | INTRAMUSCULAR | Status: DC | PRN

## 2021-11-07 MED ORDER — ACETAMINOPHEN 10 MG/ML IV SOLN
INTRAVENOUS | Status: AC
  Filled 2021-11-07: qty 100

## 2021-11-07 MED ORDER — MIDAZOLAM HCL 2 MG/2ML IJ SOLN
INTRAMUSCULAR | Status: AC
  Filled 2021-11-07: qty 2

## 2021-11-07 MED ORDER — FENTANYL CITRATE (PF) 100 MCG/2ML IJ SOLN
INTRAMUSCULAR | Status: AC
  Filled 2021-11-07: qty 2

## 2021-11-07 MED ORDER — 0.9 % SODIUM CHLORIDE (POUR BTL) OPTIME
TOPICAL | Status: DC | PRN
  Administered 2021-11-07: 13:00:00 60 mL

## 2021-11-07 MED ORDER — ONDANSETRON HCL 4 MG/2ML IJ SOLN
INTRAMUSCULAR | Status: AC
  Filled 2021-11-07: qty 2

## 2021-11-07 MED ORDER — PROPOFOL 10 MG/ML IV BOLUS
INTRAVENOUS | Status: DC | PRN
Start: 2021-11-07 — End: 2021-11-07
  Administered 2021-11-07: 160 mg via INTRAVENOUS

## 2021-11-07 MED ORDER — ONDANSETRON HCL 4 MG/2ML IJ SOLN: 4.0000 mg | Freq: Once | INTRAMUSCULAR | Status: DC | PRN

## 2021-11-07 MED ORDER — CEFAZOLIN SODIUM-DEXTROSE 2-3 GM-%(50ML) IV SOLR
INTRAVENOUS | Status: DC | PRN
  Administered 2021-11-07: 2 g via INTRAVENOUS

## 2021-11-07 MED ORDER — OXYCODONE-ACETAMINOPHEN 5-325 MG PO TABS: 1.0000 | ORAL_TABLET | Freq: Four times a day (QID) | ORAL | Status: DC | PRN

## 2021-11-07 MED ORDER — GLYCOPYRROLATE 0.2 MG/ML IJ SOLN
INTRAMUSCULAR | Status: AC
  Filled 2021-11-07: qty 1

## 2021-11-07 MED ORDER — ROCURONIUM BROMIDE 100 MG/10ML IV SOLN
INTRAVENOUS | Status: DC | PRN
Start: 2021-11-07 — End: 2021-11-07
  Administered 2021-11-07: 40 mg via INTRAVENOUS

## 2021-11-07 MED ORDER — LIDOCAINE HCL (PF) 2 % IJ SOLN
INTRAMUSCULAR | Status: AC
  Filled 2021-11-07: qty 5

## 2021-11-07 MED ORDER — ACETAMINOPHEN 10 MG/ML IV SOLN
INTRAVENOUS | Status: DC | PRN
  Administered 2021-11-07: 1000 mg via INTRAVENOUS

## 2021-11-07 MED ORDER — DEXAMETHASONE SODIUM PHOSPHATE 10 MG/ML IJ SOLN
INTRAMUSCULAR | Status: AC
  Filled 2021-11-07: qty 1

## 2021-11-07 MED ORDER — MIDAZOLAM HCL 2 MG/2ML IJ SOLN
INTRAMUSCULAR | Status: DC | PRN
Start: 2021-11-07 — End: 2021-11-07
  Administered 2021-11-07: 2 mg via INTRAVENOUS

## 2021-11-07 MED ORDER — OXYCODONE HCL 5 MG PO TABS
5.0000 mg | ORAL_TABLET | Freq: Once | ORAL | Status: AC | PRN
Start: 2021-11-07 — End: 2021-11-07

## 2021-11-07 MED ORDER — DOCUSATE SODIUM 100 MG PO CAPS: 100.0000 mg | ORAL_CAPSULE | Freq: Two times a day (BID) | ORAL | 0 refills | Status: AC

## 2021-11-07 MED ORDER — ACETAMINOPHEN 10 MG/ML IV SOLN: 1000.0000 mg | Freq: Once | INTRAVENOUS | Status: DC | PRN

## 2021-11-07 MED ORDER — CEFAZOLIN SODIUM 1 G IJ SOLR
INTRAMUSCULAR | Status: AC
  Filled 2021-11-07: qty 20

## 2021-11-07 MED ORDER — OXYCODONE HCL 5 MG PO TABS
ORAL_TABLET | ORAL | Status: AC
  Administered 2021-11-07: 14:00:00 5 mg via ORAL
  Filled 2021-11-07: qty 1

## 2021-11-07 MED ORDER — IBUPROFEN 600 MG PO TABS: 600.0000 mg | ORAL_TABLET | Freq: Four times a day (QID) | ORAL | 0 refills | Status: AC

## 2021-11-07 MED ORDER — ROCURONIUM BROMIDE 10 MG/ML (PF) SYRINGE
PREFILLED_SYRINGE | INTRAVENOUS | Status: AC
  Filled 2021-11-07: qty 10

## 2021-11-07 MED ORDER — PHENYLEPHRINE HCL (PRESSORS) 10 MG/ML IV SOLN
INTRAVENOUS | Status: DC | PRN
  Administered 2021-11-07: 160 ug via INTRAVENOUS

## 2021-11-07 MED ORDER — OXYCODONE-ACETAMINOPHEN 5-325 MG PO TABS: 1.0000 | ORAL_TABLET | Freq: Four times a day (QID) | ORAL | 0 refills | Status: AC | PRN

## 2021-11-07 MED ORDER — SODIUM CHLORIDE (PF) 0.9 % IJ SOLN
INTRAMUSCULAR | Status: AC
  Filled 2021-11-07: qty 20

## 2021-11-07 MED ORDER — DEXAMETHASONE SODIUM PHOSPHATE 10 MG/ML IJ SOLN
INTRAMUSCULAR | Status: DC | PRN
  Administered 2021-11-07: 10 mg via INTRAVENOUS

## 2021-11-07 MED ORDER — ONDANSETRON HCL 4 MG/2ML IJ SOLN
INTRAMUSCULAR | Status: DC | PRN
  Administered 2021-11-07: 4 mg via INTRAVENOUS

## 2021-11-07 MED ORDER — SIMETHICONE 80 MG PO CHEW
80.0000 mg | CHEWABLE_TABLET | ORAL | 0 refills | Status: AC | PRN
Start: 2021-11-07 — End: ?

## 2021-11-07 MED ORDER — OXYCODONE HCL 5 MG/5ML PO SOLN: 5.0000 mg | Freq: Once | ORAL | Status: AC | PRN

## 2021-11-07 MED ORDER — LACTATED RINGERS IV SOLN: INTRAVENOUS | Status: DC | PRN

## 2021-11-07 MED ORDER — FENTANYL CITRATE (PF) 100 MCG/2ML IJ SOLN
INTRAMUSCULAR | Status: AC
  Administered 2021-11-07: 14:00:00 50 ug via INTRAVENOUS
  Filled 2021-11-07: qty 2

## 2021-11-07 MED ORDER — SIMETHICONE 80 MG PO CHEW: 80.0000 mg | CHEWABLE_TABLET | Freq: Four times a day (QID) | ORAL | 0 refills | Status: AC

## 2021-11-07 MED ORDER — DEXMEDETOMIDINE (PRECEDEX) IN NS 20 MCG/5ML (4 MCG/ML) IV SYRINGE
PREFILLED_SYRINGE | INTRAVENOUS | Status: DC | PRN
  Administered 2021-11-07: 12 ug via INTRAVENOUS

## 2021-11-07 MED ORDER — SIMETHICONE 80 MG PO CHEW: 80.0000 mg | CHEWABLE_TABLET | Freq: Four times a day (QID) | ORAL | Status: DC

## 2021-11-07 MED ORDER — DEXMEDETOMIDINE (PRECEDEX) IN NS 20 MCG/5ML (4 MCG/ML) IV SYRINGE
PREFILLED_SYRINGE | INTRAVENOUS | Status: AC
  Filled 2021-11-07: qty 5

## 2021-11-07 MED ORDER — PROPOFOL 10 MG/ML IV BOLUS
INTRAVENOUS | Status: AC
  Filled 2021-11-07: qty 20

## 2021-11-07 MED ORDER — BUPIVACAINE HCL 0.5 % IJ SOLN
INTRAMUSCULAR | Status: DC | PRN
  Administered 2021-11-07: 10 mL

## 2021-11-07 MED ORDER — LACTATED RINGERS IV SOLN: INTRAVENOUS | Status: DC

## 2021-11-07 MED ORDER — FENTANYL CITRATE (PF) 100 MCG/2ML IJ SOLN
INTRAMUSCULAR | Status: DC | PRN
Start: 2021-11-07 — End: 2021-11-07
  Administered 2021-11-07: 50 ug via INTRAVENOUS

## 2021-11-07 SURGICAL SUPPLY — 30 items
BLADE SURG SZ11 CARB STEEL (BLADE) ×2 IMPLANT
CHLORAPREP W/TINT 26 (MISCELLANEOUS) ×2 IMPLANT
DERMABOND ADVANCED (GAUZE/BANDAGES/DRESSINGS) ×1
DERMABOND ADVANCED .7 DNX12 (GAUZE/BANDAGES/DRESSINGS) ×1 IMPLANT
DRAPE LAPAROTOMY 100X77 ABD (DRAPES) ×2 IMPLANT
ELECT CAUTERY BLADE 6.4 (BLADE) ×2 IMPLANT
ELECT REM PT RETURN 9FT ADLT (ELECTROSURGICAL) ×2
ELECTRODE REM PT RTRN 9FT ADLT (ELECTROSURGICAL) ×1 IMPLANT
GAUZE 4X4 16PLY ~~LOC~~+RFID DBL (SPONGE) ×2 IMPLANT
GLOVE SURG SYN 6.5 ES PF (GLOVE) ×4 IMPLANT
GLOVE SURG SYN 6.5 PF PI (GLOVE) ×2 IMPLANT
GLOVE SURG UNDER POLY LF SZ6.5 (GLOVE) ×4 IMPLANT
GOWN STRL REUS W/ TWL LRG LVL3 (GOWN DISPOSABLE) ×2 IMPLANT
GOWN STRL REUS W/TWL LRG LVL3 (GOWN DISPOSABLE) ×2
LABEL OR SOLS (LABEL) ×2 IMPLANT
MANIFOLD NEPTUNE II (INSTRUMENTS) ×2 IMPLANT
NEEDLE HYPO 22GX1.5 SAFETY (NEEDLE) ×2 IMPLANT
NS IRRIG 500ML POUR BTL (IV SOLUTION) ×2 IMPLANT
PACK BASIN MINOR ARMC (MISCELLANEOUS) ×2 IMPLANT
RETRACTOR RING XSMALL (MISCELLANEOUS) ×1 IMPLANT
RTRCTR WOUND ALEXIS 13CM XS SH (MISCELLANEOUS) ×2
SCRUB EXIDINE 4% CHG 4OZ (MISCELLANEOUS) ×2 IMPLANT
SPONGE T-LAP 4X18 ~~LOC~~+RFID (SPONGE) ×2 IMPLANT
SUT CHROMIC 0 CT 1 (SUTURE) ×4 IMPLANT
SUT MNCRL 4-0 (SUTURE) ×1
SUT MNCRL 4-0 27XMFL (SUTURE) ×1
SUT VICRYL 0 AB UR-6 (SUTURE) ×2 IMPLANT
SUTURE MNCRL 4-0 27XMF (SUTURE) ×1 IMPLANT
SYR 10ML LL (SYRINGE) ×2 IMPLANT
WATER STERILE IRR 500ML POUR (IV SOLUTION) ×2 IMPLANT

## 2021-11-07 NOTE — Anesthesia Postprocedure Evaluation (Signed)
Anesthesia Post Note  Patient: Sharon Lambert  Procedure(s) Performed: AN AD HOC LABOR EPIDURAL  Patient location during evaluation: Mother Baby Anesthesia Type: Epidural Level of consciousness: awake and alert Pain management: pain level controlled Vital Signs Assessment: post-procedure vital signs reviewed and stable Respiratory status: spontaneous breathing, nonlabored ventilation and respiratory function stable Cardiovascular status: stable Postop Assessment: no headache, no backache, epidural receding and able to ambulate Anesthetic complications: no   No notable events documented.   Last Vitals:  Vitals:   11/07/21 0308 11/07/21 0828  BP: (!) 137/94 117/89  Pulse: 78 78  Resp: 18 20  Temp: 36.6 C 36.8 C  SpO2: 98% 99%    Last Pain:  Vitals:   11/07/21 0800  TempSrc:   PainSc: 0-No pain                 Malachi Pro C

## 2021-11-07 NOTE — Anesthesia Preprocedure Evaluation (Addendum)
Anesthesia Evaluation  Patient identified by MRN, date of birth, ID band Patient awake    Reviewed: Allergy & Precautions, NPO status , Patient's Chart, lab work & pertinent test results  History of Anesthesia Complications Negative for: history of anesthetic complications  Airway Mallampati: II   Neck ROM: Full    Dental no notable dental hx.    Pulmonary former smoker (quit this pregnancy),    Pulmonary exam normal breath sounds clear to auscultation       Cardiovascular Exercise Tolerance: Good negative cardio ROS Normal cardiovascular exam Rhythm:Regular Rate:Normal     Neuro/Psych negative neurological ROS     GI/Hepatic GERD (with pregnancy)  ,  Endo/Other  negative endocrine ROS  Renal/GU negative Renal ROS     Musculoskeletal   Abdominal   Peds  Hematology negative hematology ROS (+)   Anesthesia Other Findings   Reproductive/Obstetrics S/p NSVD 11/06/21, desiring permanent sterilization; not breastfeeding                           Anesthesia Physical Anesthesia Plan  ASA: 2  Anesthesia Plan: General   Post-op Pain Management:    Induction: Intravenous  PONV Risk Score and Plan: 3 and Ondansetron, Dexamethasone and Treatment may vary due to age or medical condition  Airway Management Planned: Oral ETT  Additional Equipment:   Intra-op Plan:   Post-operative Plan: Extubation in OR  Informed Consent: I have reviewed the patients History and Physical, chart, labs and discussed the procedure including the risks, benefits and alternatives for the proposed anesthesia with the patient or authorized representative who has indicated his/her understanding and acceptance.     Dental advisory given  Plan Discussed with: CRNA  Anesthesia Plan Comments: (Patient consented for risks of anesthesia including but not limited to:  - adverse reactions to medications - damage to  eyes, teeth, lips or other oral mucosa - nerve damage due to positioning  - sore throat or hoarseness - damage to heart, brain, nerves, lungs, other parts of body or loss of life  Informed patient about role of CRNA in peri- and intra-operative care.  Patient voiced understanding.)        Anesthesia Quick Evaluation

## 2021-11-07 NOTE — Interval H&P Note (Signed)
History and Physical Interval Note:  11/07/2021 12:23 PM  Sharon Lambert  has presented today for surgery, with the diagnosis of desires permanant sterilization.  The various methods of treatment have been discussed with the patient and family. After consideration of risks, benefits and other options for treatment, the patient has consented to  Procedure(s): POST PARTUM TUBAL LIGATION (Bilateral) as a surgical intervention.  The patient's history has been reviewed, patient examined, no change in status, stable for surgery.  I have reviewed the patient's chart and labs.  Questions were answered to the patient's satisfaction.     [place]

## 2021-11-07 NOTE — Anesthesia Procedure Notes (Signed)
Procedure Name: Intubation Date/Time: 11/07/2021 12:37 PM Performed by: Doreen Salvage, CRNA Pre-anesthesia Checklist: Patient identified, Patient being monitored, Timeout performed, Emergency Drugs available and Suction available Patient Re-evaluated:Patient Re-evaluated prior to induction Oxygen Delivery Method: Circle system utilized Preoxygenation: Pre-oxygenation with 100% oxygen Induction Type: IV induction Ventilation: Mask ventilation without difficulty Laryngoscope Size: Mac, 3 and McGraph Grade View: Grade I Tube type: Oral Tube size: 7.0 mm Number of attempts: 1 Airway Equipment and Method: Stylet Placement Confirmation: ETT inserted through vocal cords under direct vision, positive ETCO2 and breath sounds checked- equal and bilateral Secured at: 21 cm Tube secured with: Tape Dental Injury: Teeth and Oropharynx as per pre-operative assessment

## 2021-11-07 NOTE — Op Note (Signed)
Operative Note  11/07/2021  PRE-OP DIAGNOSIS: Desire for permanent sterilization  POST-OP DIAGNOSIS: same  SURGEON: Yohannes Waibel MD  PROCEDURE: Postpartum Bilateral Tubal Ligation   ANESTHESIA: Choice   ESTIMATED BLOOD LOSS: 1 cc  SPECIMENS: Portion of left and right tube  COMPLICATIONS: None  DISPOSITION: PACU - hemodynamically stable.  CONDITION: stable  FINDINGS: Exam under anesthesia revealed small, mobile 20 cm uterus with no masses and bilateral adnexa without masses or fullness.   TECHNIQUE:  The patient was  prepped and draped in usual sterile fashion after adequate anesthesia is obtained in the supine position on the operating room table.  Local anesthesia is injected into the skin just inferior to the umbilicus, followed by a small elliptical incision with a scapel.  Fascia is identified and tented upwards, and an incision is made with the scalpel.  Identification of no adherent bowel is made. Retractors are placed and trendelenburg positioning is achieved.    The left Fallopian tube was identified, grasped with the Babcock clamps, lifted to the skin incision and followed out distally to the fimbriae. An avascular midsection of the tube approximately 3-4cm from the cornua was grasped with the babcock clamps and brought into a knuckle at the skin incision. The tube was double ligated with 0-0 Chromic suture and the intervening portion of tube was transected and removed. Excellent hemostasis was noted and the tube was returned to the abdomen. Attention was then turned to the right fallopian tube after confirmation of identification by tracing the tube out to the fimbriae. The same procedure was then performed on the right Fallopian tube. Again, excellent hemostasis was noted at the end of the procedure.  Retractors are removed and fascia closed with a 0-0 Vicryl suture. Irrigation and hemostasis confirmed.  Skin closed with a 4-0 monocryl suture in a subcuticular fashion  followed by skin adhesive.  Pt goes to recovery room in stable fashion.  All counts correct times 2.  Adelene Idler MD [facility], [facility] 11/07/2021 1:12 PM

## 2021-11-07 NOTE — Progress Notes (Signed)
Pt discharged with infant. Discharge instructions, prescriptions, and follow up appointments given to and reviewed with patient. Pt verbalized understanding. To be escorted out by auxillary.  °

## 2021-11-07 NOTE — Clinical Social Work Maternal (Signed)
CLINICAL SOCIAL WORK MATERNAL/CHILD NOTE  Patient Details  Name: Sharon Lambert MRN: [id number] Date of Birth: 03/04/1991  Date:  11/07/2021  Clinical Social Worker Initiating Note:  Shamera Yarberry Date/Time: Initiated:  11/07/21/      Child's Name:  Sharon Lambert   Biological Parents:  Mother, Father   Need for Interpreter:  None   Reason for Referral:  Current Substance Use/Substance Use During Pregnancy  , Other (Comment) (Limited [facility])   Address:  [address] [place] [address]    Phone number:  [phone] (home)     Additional phone number: None  Household Members/Support Persons (HM/SP):   Household Member/Support Person 1, Household Member/Support Person 2, Household Member/Support Person 3   HM/SP Name Relationship DOB or Age  HM/SP -1 Lubertha South Mother Unknown  HM/SP -2 Susann Givens Day son 15  HM/SP -3 Simore Lambert daughter 1  HM/SP -4        HM/SP -5        HM/SP -6        HM/SP -7        HM/SP -8          Natural Supports (not living in the home):  Immediate Family, Spouse/significant other   Professional Supports:     Employment: Environmental education officer   Type of Work: [employer]   Education:      Homebound arranged:    Surveyor, quantity Resources:  Medicaid   Other Resources:  Sales executive  , WIC   Cultural/Religious Considerations Which May Impact Care:  None reported  Strengths:  Ability to meet basic needs  , Compliance with medical plan  , Pediatrician chosen, Home prepared for child     Psychotropic Medications:         Pediatrician:    [employer]  Pediatrician List:   [facility]    [place]    [facility]  [facility]      Pediatrician Fax Number:    Risk Factors/Current Problems:  Substance Use     Cognitive State:  Able to Concentrate  , Alert     Mood/Affect:  Calm     CSW Assessment:  CSW received a consult for MOB and Baby  being positive for THC. Cord screen pending.  CSW spoke with RN Maddy prior to meeting with MOB. Per RN, MOB is appropriate. MOB only had 2 prenatal visits.  CSW spoke with MOB. Explained CSW's role and reason for referral.  MOB reported she is feeling good post delivery. MOB was alert and appropriate during assessment.   MOB reported they are still deciding on a first name for [employer].  Confirmed contact information for MOB. MOB and Baby will be living with MOB's Mother and 2 children (listed below) at discharge.   [facility] Drug Screen/CPS Report Policy. MOB reported she used Marijuana last in November for nausea. MOB denies CPS history. CPS Report made to [facility] CPS [employer] following assessment with MOB, as required by policy. Informed Inetta Fermo of plan for MOB and Baby to discharge today or tomorrow per RN and Inetta Fermo denied barriers to discharge.  MOB reported she did only have 2 prenatal visits. Stated she went to [facility]. Did not give reason for limited prenatal care.  MOB reported she receives [facility] and [employer] and will inform her Workers of Baby's birth. MOB plans to use [facility] for [facility]. MOB reported she has a bassinett, car seat, clothing, diapers, and all other items needed for Baby. MOB reported she has reliable transportation for herself and Baby. MOB denied resource needs at this time.   MOB denied any mental health history or current mental health needs. MOB reported she has a good support system and is coping well emotionally at this time. MOB denied the need for mental health support resources at this time. She was encouraged to reach out to her Provider if needs arise.  CSW provided education and information sheets on PPD and SIDS. MOB verbalized understanding. CSW ecouraged MOB to reach out to her Provider with any questions or needs for support or resources, even after discharge.   MOB denied any needs or  questions at this time. CSW encouraged MOB to reach out if any arise prior to discharge.   Please re consult CSW if any additional needs or concerns arise.  CSW Plan/Description:  Sudden Infant Death Syndrome (SIDS) Education, Perinatal Mood and Anxiety Disorder (PMADs) Education, Hospital Drug Screen Policy Information, Child Protective Service Report  , CSW Awaiting CPS Disposition Plan    Prabhjot Piscitello E Kjersti Dittmer, LCSW 11/07/2021, 10:58 AM

## 2021-11-07 NOTE — Transfer of Care (Signed)
Immediate Anesthesia Transfer of Care Note  Patient: Sharon Lambert  Procedure(s) Performed: POST PARTUM TUBAL LIGATION (Bilateral)  Patient Location: PACU  Anesthesia Type:General  Level of Consciousness: awake and drowsy  Airway & Oxygen Therapy: Patient Spontanous Breathing and Patient connected to face mask oxygen  Post-op Assessment: Report given to RN and Post -op Vital signs reviewed and stable  Post vital signs: Reviewed and stable  Last Vitals:  Vitals Value Taken Time  BP 108/82 11/07/21 1321  Temp 36 C 11/07/21 1321  Pulse 74 11/07/21 1324  Resp 14 11/07/21 1324  SpO2 100 % 11/07/21 1324    Last Pain:  Vitals:   11/07/21 1123  TempSrc: Oral  PainSc:          Complications: No notable events documented.

## 2021-11-07 NOTE — Anesthesia Postprocedure Evaluation (Signed)
Anesthesia Post Note  Patient: Sharon Lambert  Procedure(s) Performed: POST PARTUM TUBAL LIGATION (Bilateral)  Patient location during evaluation: PACU Anesthesia Type: General Level of consciousness: awake and alert, oriented and patient cooperative Pain management: pain level controlled Vital Signs Assessment: post-procedure vital signs reviewed and stable Respiratory status: spontaneous breathing, nonlabored ventilation and respiratory function stable Cardiovascular status: blood pressure returned to baseline and stable Postop Assessment: adequate PO intake Anesthetic complications: no   No notable events documented.   Last Vitals:  Vitals:   11/07/21 1400 11/07/21 1414  BP: 110/81 118/90  Pulse: (!) 56 66  Resp: 18 18  Temp: 36.6 C 36.4 C  SpO2: 96% 95%    Last Pain:  Vitals:   11/07/21 1430  TempSrc:   PainSc: 0-No pain                 Darrin Nipper

## 2021-11-07 NOTE — TOC CM/SW Note (Signed)
Call from [facility] CPS Worker Ladona Ridgel who inquired when patient will be DC. Informed her that per RN, it will either be today or tomorrow. Patient just got out of surgery and wants to go today but it depends on her blood pressure.  Alfonso Ramus, [place] [phone]

## 2021-11-07 NOTE — Progress Notes (Signed)
Obstetric Postpartum Daily Progress Note Subjective:  31 y.o. [id number] postpartum day #1 status post vaginal delivery.  She is ambulating, is tolerating po-but has been NPO since midnight for tubal, is voiding spontaneously.  Her pain is well controlled on PO pain medications. Her lochia is less than menses.Denies any HA, visual disturbances, or RUQ pain.    Medications SCHEDULED MEDICATIONS   docusate sodium  100 mg Oral BID   ferrous sulfate  325 mg Oral BID WC   ibuprofen  600 mg Oral Q6H   prenatal multivitamin  1 tablet Oral Q1200   Tdap  0.5 mL Intramuscular Once    MEDICATION INFUSIONS   lactated ringers 75 mL/hr at 11/07/21 0453    PRN MEDICATIONS  acetaminophen, benzocaine-Menthol, coconut oil, witch hazel-glycerin **AND** dibucaine, diphenhydrAMINE, ondansetron **OR** ondansetron (ZOFRAN) IV, oxyCODONE, oxyCODONE, simethicone, zolpidem    Objective:   Vitals:   11/06/21 2317 11/06/21 2318 11/07/21 0308 11/07/21 0828  BP: (!) 137/100 132/89 (!) 137/94 117/89  Pulse: (!) 101 79 78 78  Resp: 18  18 20   Temp: 98.3 F (36.8 C)  97.9 F (36.6 C) 98.2 F (36.8 C)  TempSrc:      SpO2: 99%  98% 99%  Weight:      Height:        Current Vital Signs 24h Vital Sign Ranges  T 98.2 F (36.8 C) Temp  Avg: 98.1 F (36.7 C)  Min: 97.9 F (36.6 C)  Max: 98.3 F (36.8 C)  BP 117/89 BP  Min: 117/89  Max: 143/89  HR 78 Pulse  Avg: 83.1  Min: 70  Max: 101  RR 20 Resp  Avg: 19.1  Min: 18  Max: 20  SaO2 99 % Room Air SpO2  Avg: 98.3 %  Min: 97 %  Max: 99 %       24 Hour I/O Current Shift I/O  Time Ins Outs 01/24 0701 - 01/25 0700 In: 480 [P.O.:480] Out: 1310 [Urine:1050] No intake/output data recorded.  General: NAD Pulmonary: no increased work of breathing Abdomen: non-distended, non-tender, fundus firm at level of umbilicus Perineum: min swelling  Extremities: no edema, no erythema, no tenderness  Labs:  Recent Labs  Lab 11/06/21 0426 11/07/21 0619  WBC 8.0 9.9  HGB  11.9* 11.3*  HCT 34.7* 33.4*  PLT 178 158     Assessment:   30 y.o. 11/09/21 postpartum day # 1 status post SVB  Plan:   1) Acute blood loss anemia - hemodynamically stable and asymptomatic - po ferrous sulfate  2) B POS / Rubella 3.71 (10/24 1147)/ Varicella Immune  3) TDAP status needs at discharge   4) bottle /Contraception = bilateral tubal ligation 11/07/21  5) Disposition, discharge later today or 1/26  6) elevated BP, CMP ordered, consider antihypertensive therapy   2/[facility], CNM  11/07/2021 10:05 AM

## 2021-11-08 ENCOUNTER — Encounter: Payer: Self-pay | Admitting: Obstetrics and Gynecology

## 2021-11-08 LAB — SURGICAL PATHOLOGY

## 2021-11-14 ENCOUNTER — Encounter: Payer: Medicaid Other | Admitting: Obstetrics and Gynecology

## 2021-11-19 ENCOUNTER — Other Ambulatory Visit: Payer: Self-pay

## 2021-11-19 ENCOUNTER — Ambulatory Visit (INDEPENDENT_AMBULATORY_CARE_PROVIDER_SITE_OTHER): Payer: Medicaid Other | Admitting: Obstetrics and Gynecology

## 2021-11-19 DIAGNOSIS — Z4889 Encounter for other specified surgical aftercare: Secondary | ICD-10-CM

## 2021-11-19 NOTE — Progress Notes (Signed)
Postpartum Visit  Chief Complaint:  Chief Complaint  Patient presents with   Gynecologic Exam    History of Present Illness: Patient is a 31 y.o. [id number] presents for postpartum visit.  Date of delivery: 11/06/2021 Type of delivery: vaginal  Laceration: none   Lochia: normal  Edinburgh Post-Partum Depression Score: 0  Date of last PAP: 02/2020   NIL   She reports she has been feeling well. Checking her Bps at home which have also been normal.   Newborn Details:  SINGLETON :  1. Infant Status: Infant doing well at home with mother.   Review of Systems: Review of Systems  Constitutional:  Negative for chills, fever, malaise/fatigue and weight loss.  HENT:  Negative for congestion, hearing loss and sinus pain.   Eyes:  Negative for blurred vision and double vision.  Respiratory:  Negative for cough, sputum production, shortness of breath and wheezing.   Cardiovascular:  Negative for chest pain, palpitations, orthopnea and leg swelling.  Gastrointestinal:  Negative for abdominal pain, constipation, diarrhea, nausea and vomiting.  Genitourinary:  Negative for dysuria, flank pain, frequency, hematuria and urgency.  Musculoskeletal:  Negative for back pain, falls and joint pain.  Skin:  Negative for itching and rash.  Neurological:  Negative for dizziness and headaches.  Psychiatric/Behavioral:  Negative for depression, substance abuse and suicidal ideas. The patient is not nervous/anxious.    Past Medical History:  Past Medical History:  Diagnosis Date   Medical history non-contributory     Past Surgical History:  Past Surgical History:  Procedure Laterality Date   cerivcal Leep     01/2019   TUBAL LIGATION Bilateral 11/07/2021   Procedure: POST PARTUM TUBAL LIGATION;  Surgeon: Homero Fellers, MD;  Location: [facility] OR[facility];  Service: Gynecology;  Laterality: Bilateral;    Family History:  Family History  Problem Relation Age of Onset   Diabetes Father    Skin cancer  Father    Prostate cancer Paternal Grandfather     Social History:  Social History   Socioeconomic History   Marital status: Significant Other    Spouse name: [name]   Number of children: Not on file   Years of education: Not on file   Highest education level: Not on file  Occupational History   Not on file  Tobacco Use   Smoking status: Former    Packs/day: 0.50    Types: Cigarettes   Smokeless tobacco: Never   Tobacco comments:    Stopped smoking 01/13/2020 when found out pregnant  Vaping Use   Vaping Use: Never used  Substance and Sexual Activity   Alcohol use: Not Currently    Comment: last use 09/2020   Drug use: Never   Sexual activity: Yes    Partners: Male    Birth control/protection: None  Other Topics Concern   Not on file  Social History Narrative   Not on file   Social Determinants of Health   Financial Resource Strain: Not on file  Food Insecurity: Not on file  Transportation Needs: Not on file  Physical Activity: Not on file  Stress: Not on file  Social Connections: Not on file  Intimate Partner Violence: Not At Risk   Fear of Current or Ex-Partner: No   Emotionally Abused: No   Physically Abused: No   Sexually Abused: No    Allergies:  No Known Allergies  Medications: Prior to Admission medications   Medication Sig Start Date End Date Taking? Authorizing Provider  docusate sodium (  COLACE) 100 MG capsule Take 1 capsule (100 mg total) by mouth 2 (two) times daily. 11/07/21   Dominic, Nunzio Cobbs, CNM  ibuprofen (ADVIL) 600 MG tablet Take 1 tablet (600 mg total) by mouth every 6 (six) hours. 11/07/21   Dominic, Nunzio Cobbs, CNM  simethicone (MYLICON) 80 MG chewable tablet Chew 1 tablet (80 mg total) by mouth as needed for flatulence. 11/07/21   Dominic, Nunzio Cobbs, CNM  simethicone (MYLICON) 80 MG chewable tablet Chew 1 tablet (80 mg total) by mouth 4 (four) times daily. 11/07/21   DominicNunzio Cobbs, CNM    Physical Exam Vitals:  Vitals:    11/19/21 1038  BP: 115/70    Physical Exam Constitutional:      Appearance: Normal appearance. She is well-developed.  HENT:     Head: Normocephalic and atraumatic.  Eyes:     Extraocular Movements: Extraocular movements intact.     Pupils: Pupils are equal, round, and reactive to light.  Neck:     Thyroid: No thyromegaly.  Cardiovascular:     Rate and Rhythm: Normal rate and regular rhythm.     Heart sounds: Normal heart sounds.  Pulmonary:     Effort: Pulmonary effort is normal.     Breath sounds: Normal breath sounds.  Abdominal:     General: Bowel sounds are normal. There is no distension.     Palpations: Abdomen is soft. There is no mass.     Comments: Incision is clean, dry, intact  Musculoskeletal:     Cervical back: Neck supple.  Neurological:     Mental Status: She is alert and oriented to person, place, and time.  Skin:    General: Skin is warm and dry.  Psychiatric:        Behavior: Behavior normal.        Thought Content: Thought content normal.        Judgment: Judgment normal.  Vitals reviewed.    Assessment: 30 y.o. [id number] presenting for 6 week postpartum visit  Plan: Problem List Items Addressed This Visit   None Visit Diagnoses     Postpartum care and examination    -  Primary       1) Contraception-  tubal ligation  2)  Pap: up to date  3) Patient underwent screening for postpartum depression with no concerns noted.  4) Blood pressure: normal today in office  - Follow up in 5 weeks   Adrian Prows MD, [place], [place] 11/19/2021 10:39 AM

## 2021-12-26 ENCOUNTER — Ambulatory Visit: Payer: Medicaid Other | Admitting: Obstetrics and Gynecology
